# Patient Record
Sex: Male | Born: 1960 | Race: Black or African American | Hispanic: No | Marital: Married | Smoking: Never smoker
Health system: Southern US, Community
[De-identification: ages and names within clinical notes are randomized; demographics above are authoritative.]

## PROBLEM LIST (undated history)

## (undated) DIAGNOSIS — I10 Essential (primary) hypertension: Secondary | ICD-10-CM

## (undated) DIAGNOSIS — E119 Type 2 diabetes mellitus without complications: Secondary | ICD-10-CM

## (undated) HISTORY — PX: COLONOSCOPY: SHX174

---

## 2013-08-08 ENCOUNTER — Emergency Department (HOSPITAL_COMMUNITY)
Admission: EM | Admit: 2013-08-08 | Discharge: 2013-08-08 | Disposition: A | Discharge status: Home / Self Care | Payer: Self-pay | Attending: Emergency Medicine | Admitting: Emergency Medicine

## 2013-08-08 ENCOUNTER — Encounter (HOSPITAL_COMMUNITY): Payer: Self-pay | Admitting: Neurology

## 2013-08-08 ENCOUNTER — Emergency Department (HOSPITAL_COMMUNITY): Payer: Self-pay

## 2013-08-08 DIAGNOSIS — I10 Essential (primary) hypertension: Secondary | ICD-10-CM | POA: Insufficient documentation

## 2013-08-08 DIAGNOSIS — E119 Type 2 diabetes mellitus without complications: Secondary | ICD-10-CM | POA: Insufficient documentation

## 2013-08-08 DIAGNOSIS — M25512 Pain in left shoulder: Secondary | ICD-10-CM

## 2013-08-08 DIAGNOSIS — G8921 Chronic pain due to trauma: Secondary | ICD-10-CM | POA: Insufficient documentation

## 2013-08-08 HISTORY — DX: Type 2 diabetes mellitus without complications: E11.9

## 2013-08-08 HISTORY — DX: Essential (primary) hypertension: I10

## 2013-08-08 MED ORDER — HYDROCODONE-ACETAMINOPHEN 5-325 MG PO TABS: 1.0000 | ORAL_TABLET | ORAL | Status: DC | PRN

## 2013-08-08 NOTE — ED Notes (Signed)
Pt reporting about month ago hurt [place] left shoulder [place]. Was given rx for pain but pain has been worse over past few days. Pt is a x 4. Pt injured shoulder in [place].
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
## 2013-08-12 NOTE — ED Provider Notes (Signed)
I saw and evaluated the patient, reviewed the resident's note and I agree with the findings and plan. Acute on chronic shoulder pain. Previous x-ray reported negative but one today also did not show a fracture. Will foloow with ortho  Juliet Rude. Rubin Payor, MD 08/12/13 1450

## 2014-04-19 IMAGING — CR DG SHOULDER 2+V*L*
5 series · 5 of 5 positions shown · non-contrast
Comparison: 09/18/2009

CLINICAL DATA: Left shoulder pain following injury

EXAM:
LEFT SHOULDER - 2+ VIEW

[w shoulder ap internal left]
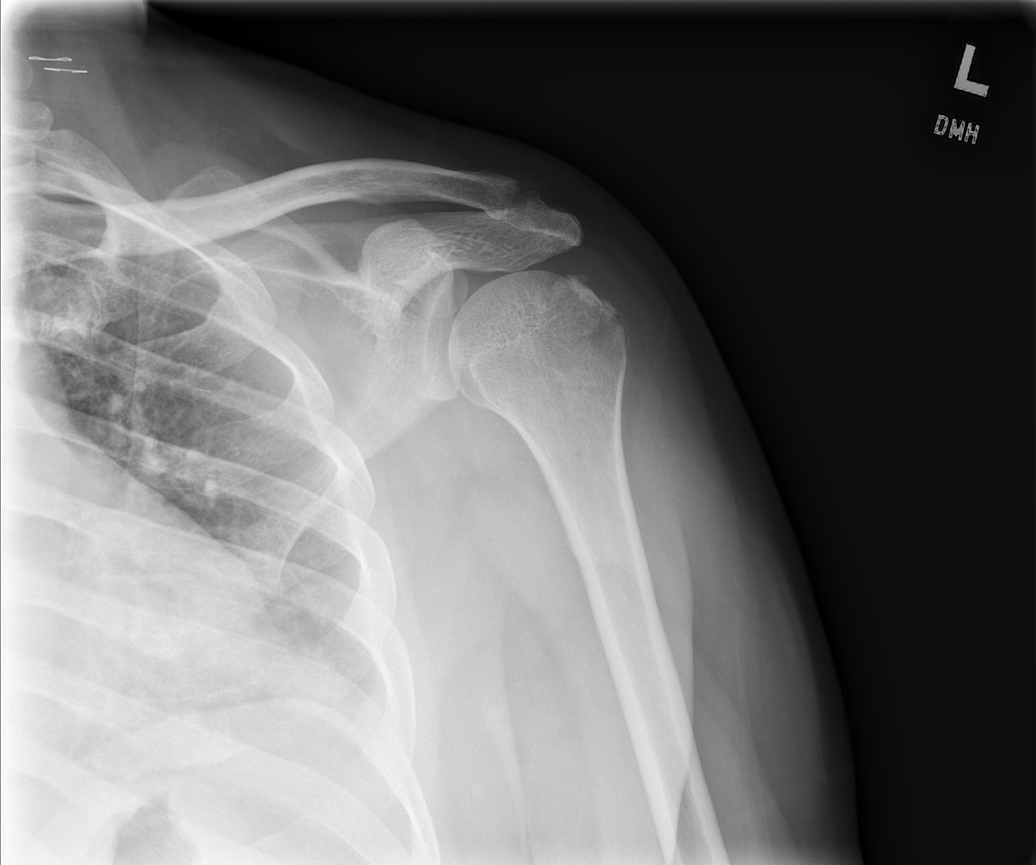

[w shoulder ap external left]
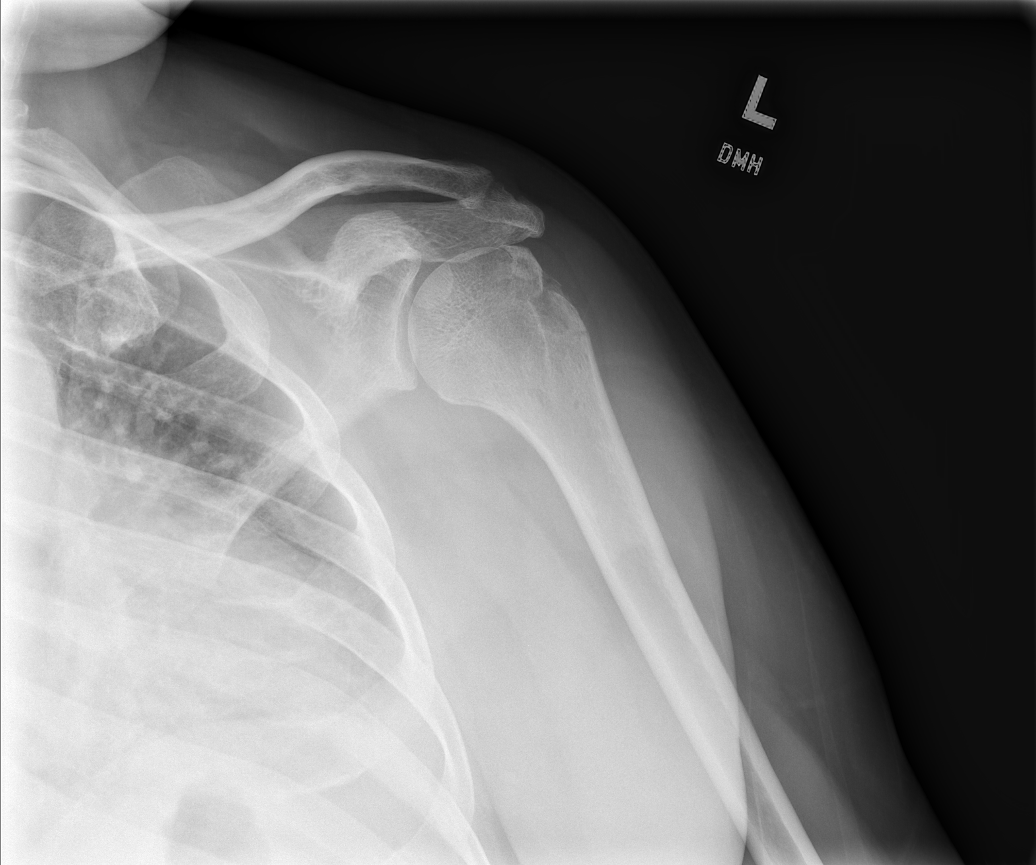

[w shoulder y view left]
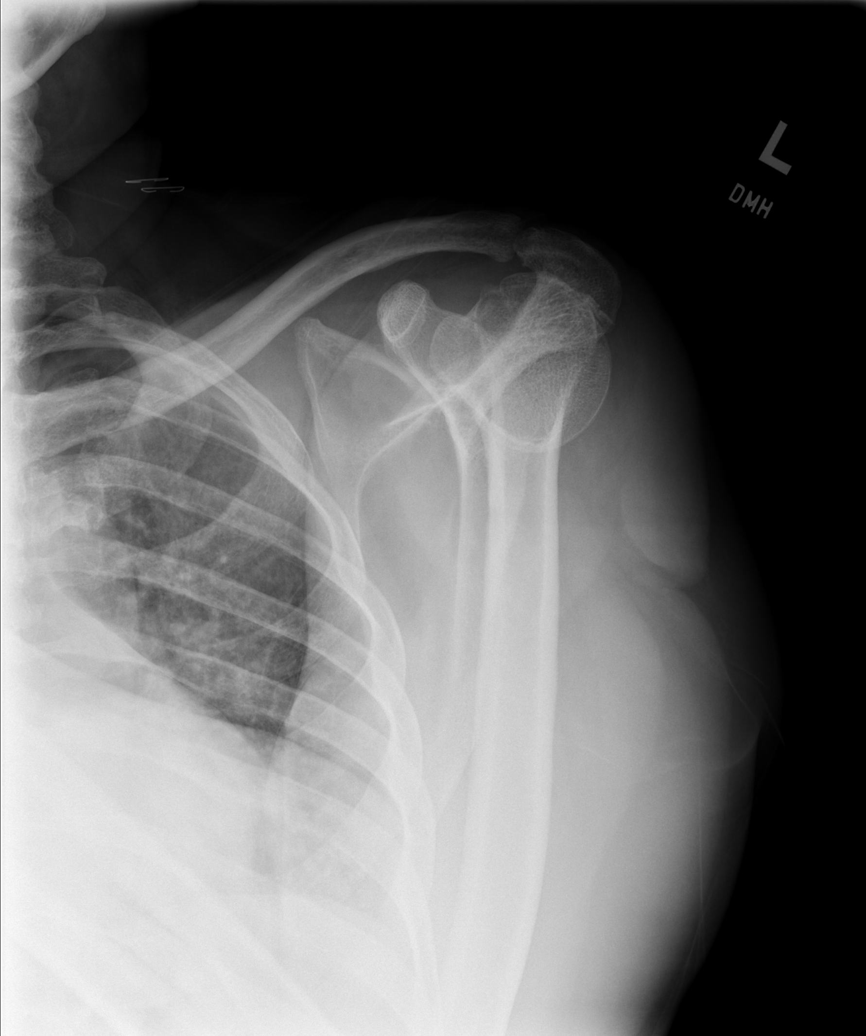

[x shoulder axillary left (1 of 2)]
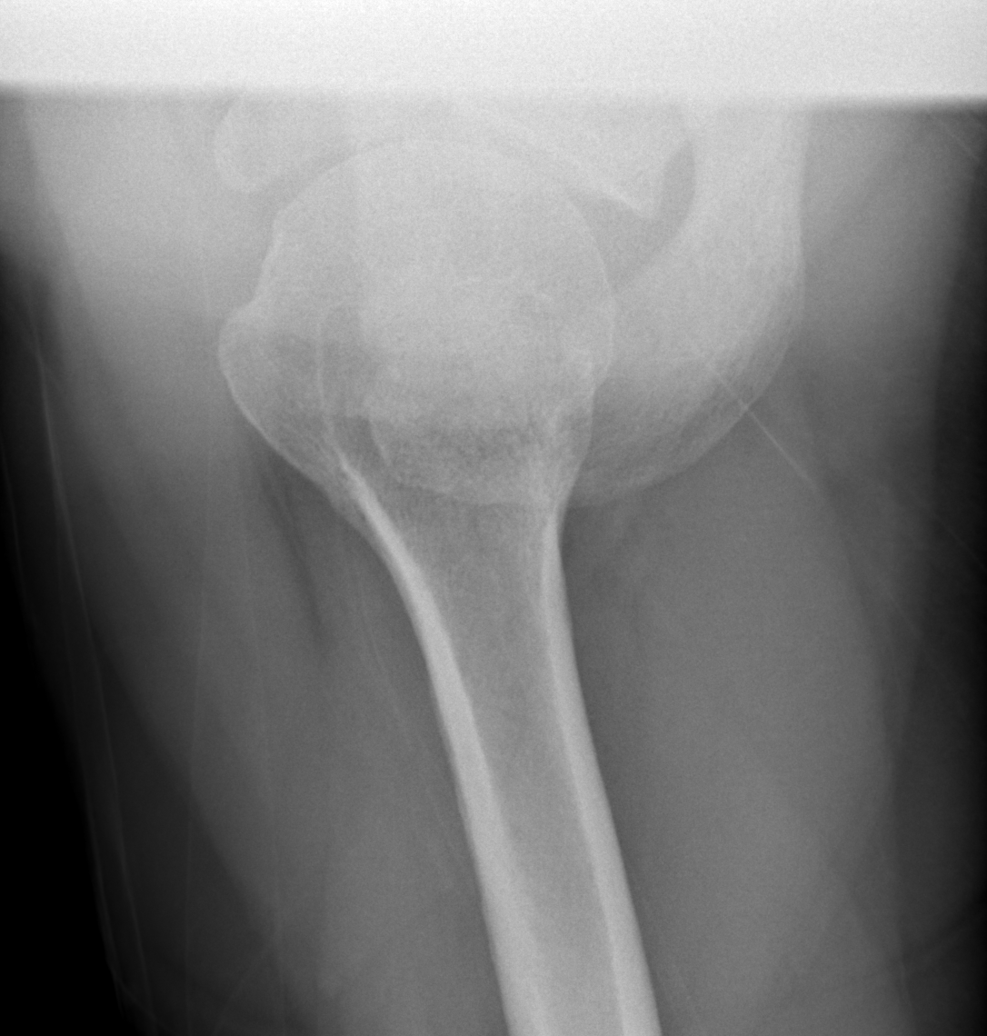

[x shoulder axillary left (2 of 2)]
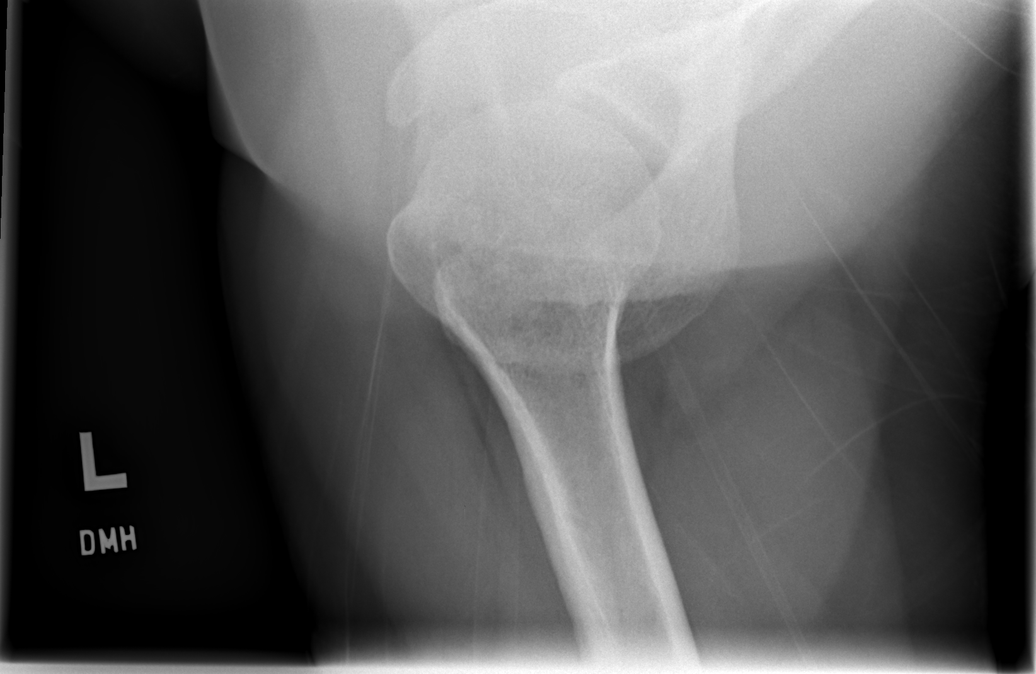

[5 of 5 positions shown; findings below may reference images not displayed]

FINDINGS: There is irregularity of the proximal humerus involving
predominantly the greater tuberosity. There is lucency and some
impaction identified which may be related to chronic dislocations.
Multiple small bony fragments are identified. The majority of these
are well corticated and likely related to prior injury. Additionally
some calcific tendonitis may be present. No other fracture is seen.
IMPRESSION: Changes in the proximal humerus as described above. The majority of
these appear chronic in nature although an acute on chronic
component cannot be totally excluded. These changes are new from a
previous exam dated 09/18/2009. Cross-sectional imaging may be
helpful for further evaluation.

## 2014-07-23 ENCOUNTER — Ambulatory Visit: Payer: Self-pay | Admitting: Internal Medicine

## 2014-08-04 ENCOUNTER — Encounter: Payer: Self-pay | Admitting: Internal Medicine

## 2014-08-04 ENCOUNTER — Ambulatory Visit: Payer: No Typology Code available for payment source | Attending: Internal Medicine | Admitting: Internal Medicine

## 2014-08-04 VITALS — BP 136/87 | HR 76 | Temp 98.0°F | Resp 16 | Wt 239.6 lb

## 2014-08-04 DIAGNOSIS — E119 Type 2 diabetes mellitus without complications: Secondary | ICD-10-CM | POA: Insufficient documentation

## 2014-08-04 DIAGNOSIS — Z139 Encounter for screening, unspecified: Secondary | ICD-10-CM

## 2014-08-04 DIAGNOSIS — I10 Essential (primary) hypertension: Secondary | ICD-10-CM | POA: Insufficient documentation

## 2014-08-04 DIAGNOSIS — E089 Diabetes mellitus due to underlying condition without complications: Secondary | ICD-10-CM

## 2014-08-04 DIAGNOSIS — Z23 Encounter for immunization: Secondary | ICD-10-CM | POA: Insufficient documentation

## 2014-08-04 DIAGNOSIS — E139 Other specified diabetes mellitus without complications: Secondary | ICD-10-CM

## 2014-08-04 LAB — POCT GLYCOSYLATED HEMOGLOBIN (HGB A1C): Hemoglobin A1C: 7.5

## 2014-08-04 LAB — GLUCOSE, POCT (MANUAL RESULT ENTRY): POC Glucose: 264 mg/dl — AB (ref 70–99)

## 2014-08-04 MED ORDER — FREESTYLE SYSTEM KIT: 1.0000 | PACK | Status: AC | PRN

## 2014-08-04 MED ORDER — ATENOLOL 50 MG PO TABS: 50.0000 mg | ORAL_TABLET | Freq: Every day | ORAL | Status: AC

## 2014-08-04 MED ORDER — HYDROCHLOROTHIAZIDE 25 MG PO TABS: 25.0000 mg | ORAL_TABLET | Freq: Every day | ORAL | Status: AC

## 2014-08-04 MED ORDER — SITAGLIPTIN PHOS-METFORMIN HCL 50-1000 MG PO TABS: 1.0000 | ORAL_TABLET | Freq: Two times a day (BID) | ORAL | Status: DC

## 2014-08-04 NOTE — Progress Notes (Signed)
Patient here to establish care Has history of DM and HTN 

## 2014-08-04 NOTE — Progress Notes (Signed)
Patient Demographics  Miguel Valdez, is a 53 y.o. male  XMI:[id number]  MGN:[id number]  DOB - Jan 10, 1961  CC:  Chief Complaint  Patient presents with  . Establish Care       HPI: Miguel Valdez is a 53 y.o. male here today to establish medical care. He has History of hypertension, diabetes, patient is taking atenolol, hydrochlorothiazide and Janumet, as per patient since he did not had insurance he has been taking Janumet one time only, today his hemoglobin A1c is 7.5%, patient denies any hypoglycemic symptoms denies any chest and shortness of breath, patient denies smoking cigarettes. As per patient 2 years ago he had a colonoscopy done, patient will bring the reports on the next visit. Patient has No headache, No chest pain, No abdominal pain - No Nausea, No new weakness tingling or numbness, No Cough - SOB.  No Known Allergies Past Medical History  Diagnosis Date  . Hypertension   . Diabetes mellitus without complication    Current Outpatient Prescriptions on File Prior to Visit  Medication Sig Dispense Refill  . HYDROcodone-acetaminophen (NORCO/VICODIN) 5-325 MG per tablet Take 1 tablet by mouth every 4 (four) hours as needed for pain.  20 tablet  0   No current facility-administered medications on file prior to visit.   Family History  Problem Relation Age of Onset  . Hypertension Mother   . Diabetes Mother   . Cancer Maternal Grandmother    History   Social History  . Marital Status: Married    Spouse Name: N/A    Number of Children: N/A  . Years of Education: N/A   Occupational History  . Not on file.   Social History Main Topics  . Smoking status: Never Smoker   . Smokeless tobacco: Not on file  . Alcohol Use: No  . Drug Use: No  . Sexual Activity: Not on file   Other Topics Concern  . Not on file   Social History Narrative  . No narrative on file    Review of Systems: Constitutional: Negative for fever, chills, diaphoresis, activity change, appetite  change and fatigue. HENT: Negative for ear pain, nosebleeds, congestion, facial swelling, rhinorrhea, neck pain, neck stiffness and ear discharge.  Eyes: Negative for pain, discharge, redness, itching and visual disturbance. Respiratory: Negative for cough, choking, chest tightness, shortness of breath, wheezing and stridor.  Cardiovascular: Negative for chest pain, palpitations and leg swelling. Gastrointestinal: Negative for abdominal distention. Genitourinary: Negative for dysuria, urgency, frequency, hematuria, flank pain, decreased urine volume, difficulty urinating and dyspareunia.  Musculoskeletal: Negative for back pain, joint swelling, arthralgia and gait problem. Neurological: Negative for dizziness, tremors, seizures, syncope, facial asymmetry, speech difficulty, weakness, light-headedness, numbness and headaches.  Hematological: Negative for adenopathy. Does not bruise/bleed easily. Psychiatric/Behavioral: Negative for hallucinations, behavioral problems, confusion, dysphoric mood, decreased concentration and agitation.    Objective:   Filed Vitals:   08/04/14 1407  BP: 136/87  Pulse: 76  Temp: 98 F (36.7 C)  Resp: 16    Physical Exam: Constitutional: Patient appears well-developed and well-nourished. No distress. HENT: Normocephalic, atraumatic, External right and left ear normal. Oropharynx is clear and moist.  Eyes: Conjunctivae and EOM are normal. PERRLA, no scleral icterus. Neck: Normal ROM. Neck supple. No JVD. No tracheal deviation. No thyromegaly. CVS: RRR, S1/S2 +, no murmurs, no gallops, no carotid bruit.  Pulmonary: Effort and breath sounds normal, no stridor, rhonchi, wheezes, rales.  Abdominal: Soft. BS +, no distension, tenderness, rebound or guarding.  Musculoskeletal:  Normal range of motion. No edema and no tenderness.  Neuro: Alert. Normal reflexes, muscle tone coordination. No cranial nerve deficit. Skin: Skin is warm and dry. No rash noted. Not  diaphoretic. No erythema. No pallor. Psychiatric: Normal mood and affect. Behavior, judgment, thought content normal.  No results found for this basename: WBC, HGB, HCT, MCV, PLT   No results found for this basename: CREATININE, BUN, NA, K, CL, CO2    Lab Results  Component Value Date   HGBA1C 7.5% 08/04/2014   Lipid Panel  No results found for this basename: chol, trig, hdl, cholhdl, vldl, ldlcalc       Assessment and plan:   1. Diabetes mellitus due to underlying condition without complications Results for orders placed in visit on 08/04/14  GLUCOSE, POCT (MANUAL RESULT ENTRY)      Result Value Ref Range   POC Glucose 264 (*) 70 - 99 mg/dl  POCT GLYCOSYLATED HEMOGLOBIN (HGB A1C)      Result Value Ref Range   Hemoglobin A1C 7.5%     Patient is advised for diabetes meal planning, he is given prescription for Janumet advise patient to take 2 times a day, will repeat his A1c in 3 months.  - COMPLETE METABOLIC PANEL WITH GFR; Future - Ambulatory referral to Ophthalmology - sitaGLIPtin-metformin (JANUMET) 50-1000 MG per tablet; Take 1 tablet by mouth 2 (two) times daily with a meal.  Dispense: 60 tablet; Refill: 3 - glucose monitoring kit (FREESTYLE) monitoring kit; 1 each by Does not apply route as needed for other. Dispense any model that is covered- dispense testing supplies for Q AC/ HS accuchecks- 1 month supply with one refil.  Dispense: 1 each; Refill: 1  2. Essential hypertension, benign  blood pressure is borderline elevated, advise patient for DASH diet, continue with Current meds - hydrochlorothiazide (HYDRODIURIL) 25 MG tablet; Take 1 tablet (25 mg total) by mouth daily.  Dispense: 30 tablet; Refill: 3 - atenolol (TENORMIN) 50 MG tablet; Take 1 tablet (50 mg total) by mouth daily.  Dispense: 30 tablet; Refill: 3  3. Screening  ordered fasting blood work.   - CBC with Differential; Future - Lipid panel; Future - TSH; Future - Vit D  25 hydroxy (rtn osteoporosis  monitoring); Future      Health Maintenance -Colonoscopy: uptodate patient will bring the reports     -Influenza shot today   Return in about 3 months (around 11/03/2014) for diabetes, hypertension.   Lorayne Marek, MD

## 2014-08-04 NOTE — Patient Instructions (Addendum)
Diabetes Mellitus and Food It is important for you to manage your blood sugar (glucose) level. Your blood glucose level can be greatly affected by what you eat. Eating healthier foods in the appropriate amounts throughout the day at about the same time each day will help you control your blood glucose level. It can also help slow or prevent worsening of your diabetes mellitus. Healthy eating may even help you improve the level of your blood pressure and reach or maintain a healthy weight.  HOW CAN FOOD AFFECT ME? Carbohydrates Carbohydrates affect your blood glucose level more than any other type of food. Your dietitian will help you determine how many carbohydrates to eat at each meal and teach you how to count carbohydrates. Counting carbohydrates is important to keep your blood glucose at a healthy level, especially if you are using insulin or taking certain medicines for diabetes mellitus. Alcohol Alcohol can cause sudden decreases in blood glucose (hypoglycemia), especially if you use insulin or take certain medicines for diabetes mellitus. Hypoglycemia can be a life-threatening condition. Symptoms of hypoglycemia (sleepiness, dizziness, and disorientation) are similar to symptoms of having too much alcohol.  If your health care provider has given you approval to drink alcohol, do so in moderation and use the following guidelines:  Women should not have more than one drink per day, and men should not have more than two drinks per day. One drink is equal to:  12 oz of beer.  5 oz of wine.  1 oz of hard liquor.  Do not drink on an empty stomach.  Keep yourself hydrated. Have water, diet soda, or unsweetened iced tea.  Regular soda, juice, and other mixers might contain a lot of carbohydrates and should be counted. WHAT FOODS ARE NOT RECOMMENDED? As you make food choices, it is important to remember that all foods are not the same. Some foods have fewer nutrients per serving than other  foods, even though they might have the same number of calories or carbohydrates. It is difficult to get your body what it needs when you eat foods with fewer nutrients. Examples of foods that you should avoid that are high in calories and carbohydrates but low in nutrients include:  Trans fats (most processed foods list trans fats on the Nutrition Facts label).  Regular soda.  Juice.  Candy.  Sweets, such as cake, pie, doughnuts, and cookies.  Fried foods. WHAT FOODS CAN I EAT? Have nutrient-rich foods, which will nourish your body and keep you healthy. The food you should eat also will depend on several factors, including:  The calories you need.  The medicines you take.  Your weight.  Your blood glucose level.  Your blood pressure level.  Your cholesterol level. You also should eat a variety of foods, including:  Protein, such as meat, poultry, fish, tofu, nuts, and seeds (lean animal proteins are best).  Fruits.  Vegetables.  Dairy products, such as milk, cheese, and yogurt (low fat is best).  Breads, grains, pasta, cereal, rice, and beans.  Fats such as olive oil, trans fat-free margarine, canola oil, avocado, and olives. DOES EVERYONE WITH DIABETES MELLITUS HAVE THE SAME MEAL PLAN? Because every person with diabetes mellitus is different, there is not one meal plan that works for everyone. It is very important that you meet with a dietitian who will help you create a meal plan that is just right for you. Document Released: 07/21/2005 Document Revised: 10/29/2013 Document Reviewed: 09/20/2013 ExitCare Patient Information 2015 [employer]. This   information is not intended to replace advice given to you by your health care provider. Make sure you discuss any questions you have with your health care provider. DASH Eating Plan DASH stands for "Dietary Approaches to Stop Hypertension." The DASH eating plan is a healthy eating plan that has been shown to reduce high  blood pressure (hypertension). Additional health benefits may include reducing the risk of type 2 diabetes mellitus, heart disease, and stroke. The DASH eating plan may also help with weight loss. WHAT DO I NEED TO KNOW ABOUT THE DASH EATING PLAN? For the DASH eating plan, you will follow these general guidelines:  Choose foods with a percent daily value for sodium of less than 5% (as listed on the food label).  Use salt-free seasonings or herbs instead of table salt or sea salt.  Check with your health care provider or pharmacist before using salt substitutes.  Eat lower-sodium products, often labeled as "lower sodium" or "no salt added."  Eat fresh foods.  Eat more vegetables, fruits, and low-fat dairy products.  Choose whole grains. Look for the word "whole" as the first word in the ingredient list.  Choose fish and skinless chicken or turkey more often than red meat. Limit fish, poultry, and meat to 6 oz (170 g) each day.  Limit sweets, desserts, sugars, and sugary drinks.  Choose heart-healthy fats.  Limit cheese to 1 oz (28 g) per day.  Eat more home-cooked food and less restaurant, buffet, and fast food.  Limit fried foods.  Cook foods using methods other than frying.  Limit canned vegetables. If you do use them, rinse them well to decrease the sodium.  When eating at a restaurant, ask that your food be prepared with less salt, or no salt if possible. WHAT FOODS CAN I EAT? Seek help from a dietitian for individual calorie needs. Grains Whole grain or whole wheat bread. Brown rice. Whole grain or whole wheat pasta. Quinoa, bulgur, and whole grain cereals. Low-sodium cereals. Corn or whole wheat flour tortillas. Whole grain cornbread. Whole grain crackers. Low-sodium crackers. Vegetables Fresh or frozen vegetables (raw, steamed, roasted, or grilled). Low-sodium or reduced-sodium tomato and vegetable juices. Low-sodium or reduced-sodium tomato sauce and paste. Low-sodium  or reduced-sodium canned vegetables.  Fruits All fresh, canned (in natural juice), or frozen fruits. Meat and Other Protein Products Ground beef (85% or leaner), grass-fed beef, or beef trimmed of fat. Skinless chicken or turkey. Ground chicken or turkey. Pork trimmed of fat. All fish and seafood. Eggs. Dried beans, peas, or lentils. Unsalted nuts and seeds. Unsalted canned beans. Dairy Low-fat dairy products, such as skim or 1% milk, 2% or reduced-fat cheeses, low-fat ricotta or cottage cheese, or plain low-fat yogurt. Low-sodium or reduced-sodium cheeses. Fats and Oils Tub margarines without trans fats. Light or reduced-fat mayonnaise and salad dressings (reduced sodium). Avocado. Safflower, olive, or canola oils. Natural peanut or almond butter. Other Unsalted popcorn and pretzels. The items listed above may not be a complete list of recommended foods or beverages. Contact your dietitian for more options. WHAT FOODS ARE NOT RECOMMENDED? Grains White bread. White pasta. White rice. Refined cornbread. Bagels and croissants. Crackers that contain trans fat. Vegetables Creamed or fried vegetables. Vegetables in a cheese sauce. Regular canned vegetables. Regular canned tomato sauce and paste. Regular tomato and vegetable juices. Fruits Dried fruits. Canned fruit in light or heavy syrup. Fruit juice. Meat and Other Protein Products Fatty cuts of meat. Ribs, chicken wings, bacon, sausage, bologna, salami, chitterlings, fatback, hot   dogs, bratwurst, and packaged luncheon meats. Salted nuts and seeds. Canned beans with salt. Dairy Whole or 2% milk, cream, half-and-half, and cream cheese. Whole-fat or sweetened yogurt. Full-fat cheeses or blue cheese. Nondairy creamers and whipped toppings. Processed cheese, cheese spreads, or cheese curds. Condiments Onion and garlic salt, seasoned salt, table salt, and sea salt. Canned and packaged gravies. Worcestershire sauce. Tartar sauce. Barbecue sauce.  Teriyaki sauce. Soy sauce, including reduced sodium. Steak sauce. Fish sauce. Oyster sauce. Cocktail sauce. Horseradish. Ketchup and mustard. Meat flavorings and tenderizers. Bouillon cubes. Hot sauce. Tabasco sauce. Marinades. Taco seasonings. Relishes. Fats and Oils Butter, stick margarine, lard, shortening, ghee, and bacon fat. Coconut, palm kernel, or palm oils. Regular salad dressings. Other Pickles and olives. Salted popcorn and pretzels. The items listed above may not be a complete list of foods and beverages to avoid. Contact your dietitian for more information. WHERE CAN I FIND MORE INFORMATION? National Heart, [employer], and [employer] Institute: www.nhlbi.nih.gov/health/health-topics/topics/dash/ Document Released: 10/13/2011 Document Revised: 03/10/2014 Document Reviewed: 08/28/2013 ExitCare Patient Information 2015 [employer]. This information is not intended to replace advice given to you by your health care provider. Make sure you discuss any questions you have with your health care provider.  

## 2014-08-07 ENCOUNTER — Ambulatory Visit: Payer: No Typology Code available for payment source | Attending: Internal Medicine

## 2014-08-11 ENCOUNTER — Ambulatory Visit: Payer: No Typology Code available for payment source | Attending: Internal Medicine

## 2014-08-11 DIAGNOSIS — E089 Diabetes mellitus due to underlying condition without complications: Secondary | ICD-10-CM

## 2014-08-11 DIAGNOSIS — Z139 Encounter for screening, unspecified: Secondary | ICD-10-CM

## 2014-08-11 LAB — CBC WITH DIFFERENTIAL/PLATELET
BASOS PCT: 1 % (ref 0–1)
Basophils Absolute: 0.1 10*3/uL (ref 0.0–0.1)
Eosinophils Absolute: 0.3 10*3/uL (ref 0.0–0.7)
Eosinophils Relative: 5 % (ref 0–5)
HCT: 40.9 % (ref 39.0–52.0)
Hemoglobin: 13.8 g/dL (ref 13.0–17.0)
LYMPHS PCT: 31 % (ref 12–46)
Lymphs Abs: 2.1 10*3/uL (ref 0.7–4.0)
MCH: 26.1 pg (ref 26.0–34.0)
MCHC: 33.7 g/dL (ref 30.0–36.0)
MCV: 77.3 fL — ABNORMAL LOW (ref 78.0–100.0)
MONO ABS: 0.5 10*3/uL (ref 0.1–1.0)
Monocytes Relative: 7 % (ref 3–12)
NEUTROS PCT: 56 % (ref 43–77)
Neutro Abs: 3.9 10*3/uL (ref 1.7–7.7)
Platelets: 291 10*3/uL (ref 150–400)
RBC: 5.29 MIL/uL (ref 4.22–5.81)
RDW: 14.5 % (ref 11.5–15.5)
WBC: 6.9 10*3/uL (ref 4.0–10.5)

## 2014-08-11 LAB — COMPLETE METABOLIC PANEL WITH GFR
ALBUMIN: 3.6 g/dL (ref 3.5–5.2)
ALT: 20 U/L (ref 0–53)
AST: 16 U/L (ref 0–37)
Alkaline Phosphatase: 82 U/L (ref 39–117)
BUN: 17 mg/dL (ref 6–23)
CALCIUM: 9.4 mg/dL (ref 8.4–10.5)
CO2: 28 mEq/L (ref 19–32)
Chloride: 99 mEq/L (ref 96–112)
Creat: 0.95 mg/dL (ref 0.50–1.35)
Glucose, Bld: 149 mg/dL — ABNORMAL HIGH (ref 70–99)
POTASSIUM: 4.6 meq/L (ref 3.5–5.3)
Sodium: 139 mEq/L (ref 135–145)
Total Bilirubin: 0.5 mg/dL (ref 0.2–1.2)
Total Protein: 8 g/dL (ref 6.0–8.3)

## 2014-08-11 LAB — LIPID PANEL
CHOL/HDL RATIO: 4 ratio
Cholesterol: 158 mg/dL (ref 0–200)
HDL: 40 mg/dL (ref >39)
LDL CALC: 87 mg/dL (ref 0–99)
TRIGLYCERIDES: 153 mg/dL — AB (ref <150)
VLDL: 31 mg/dL (ref 0–40)

## 2014-08-11 LAB — TSH: TSH: 1.441 u[IU]/mL (ref 0.350–4.500)

## 2014-08-12 ENCOUNTER — Other Ambulatory Visit: Payer: Self-pay

## 2014-08-12 DIAGNOSIS — E089 Diabetes mellitus due to underlying condition without complications: Secondary | ICD-10-CM

## 2014-08-12 LAB — VITAMIN D 25 HYDROXY (VIT D DEFICIENCY, FRACTURES): Vit D, 25-Hydroxy: 35 ng/mL (ref 30–89)

## 2014-08-12 MED ORDER — SITAGLIPTIN PHOS-METFORMIN HCL 50-1000 MG PO TABS: 1.0000 | ORAL_TABLET | Freq: Two times a day (BID) | ORAL | Status: AC

## 2014-08-13 ENCOUNTER — Telehealth: Payer: Self-pay | Admitting: *Deleted

## 2014-08-13 NOTE — Telephone Encounter (Signed)
Message copied by Raynelle CharyWINFREE, Naveh Rickles R on Wed Aug 13, 2014 10:11 AM ------      Message from: Doris CheadleADVANI, DEEPAK      Created: Tue Aug 12, 2014  5:01 PM       Call and let the patient know that his blood work is otherwise normal except for elevated glucose level, advise patient for low carbohydrate diet and compliance with  taking diabetes medication. ------

## 2014-08-13 NOTE — Telephone Encounter (Signed)
Pt is aware of his lab results. 

## 2020-02-13 ENCOUNTER — Encounter (HOSPITAL_COMMUNITY): Payer: Self-pay

## 2020-02-13 ENCOUNTER — Emergency Department (HOSPITAL_COMMUNITY): Payer: PRIVATE HEALTH INSURANCE

## 2020-02-13 ENCOUNTER — Other Ambulatory Visit: Payer: Self-pay

## 2020-02-13 ENCOUNTER — Inpatient Hospital Stay (HOSPITAL_COMMUNITY)
Admission: EM | Admit: 2020-02-13 | Discharge: 2020-02-18 | DRG: 177 | Disposition: A | Discharge status: Home / Self Care | Payer: PRIVATE HEALTH INSURANCE | Attending: Internal Medicine | Admitting: Internal Medicine

## 2020-02-13 DIAGNOSIS — R0602 Shortness of breath: Secondary | ICD-10-CM | POA: Diagnosis not present

## 2020-02-13 DIAGNOSIS — E860 Dehydration: Secondary | ICD-10-CM | POA: Diagnosis present

## 2020-02-13 DIAGNOSIS — J9601 Acute respiratory failure with hypoxia: Secondary | ICD-10-CM | POA: Diagnosis present

## 2020-02-13 DIAGNOSIS — Z809 Family history of malignant neoplasm, unspecified: Secondary | ICD-10-CM

## 2020-02-13 DIAGNOSIS — I10 Essential (primary) hypertension: Secondary | ICD-10-CM | POA: Diagnosis present

## 2020-02-13 DIAGNOSIS — Z833 Family history of diabetes mellitus: Secondary | ICD-10-CM

## 2020-02-13 DIAGNOSIS — Z7982 Long term (current) use of aspirin: Secondary | ICD-10-CM

## 2020-02-13 DIAGNOSIS — U071 COVID-19: Principal | ICD-10-CM | POA: Diagnosis present

## 2020-02-13 DIAGNOSIS — R0902 Hypoxemia: Secondary | ICD-10-CM

## 2020-02-13 DIAGNOSIS — J1282 Pneumonia due to coronavirus disease 2019: Secondary | ICD-10-CM | POA: Diagnosis present

## 2020-02-13 DIAGNOSIS — Z7984 Long term (current) use of oral hypoglycemic drugs: Secondary | ICD-10-CM

## 2020-02-13 DIAGNOSIS — E871 Hypo-osmolality and hyponatremia: Secondary | ICD-10-CM | POA: Diagnosis present

## 2020-02-13 DIAGNOSIS — E089 Diabetes mellitus due to underlying condition without complications: Secondary | ICD-10-CM | POA: Diagnosis present

## 2020-02-13 DIAGNOSIS — E119 Type 2 diabetes mellitus without complications: Secondary | ICD-10-CM | POA: Diagnosis present

## 2020-02-13 DIAGNOSIS — Z8249 Family history of ischemic heart disease and other diseases of the circulatory system: Secondary | ICD-10-CM

## 2020-02-13 LAB — BASIC METABOLIC PANEL
Anion gap: 12 (ref 5–15)
BUN: 8 mg/dL (ref 6–20)
CO2: 23 mmol/L (ref 22–32)
Calcium: 8 mg/dL — ABNORMAL LOW (ref 8.9–10.3)
Chloride: 92 mmol/L — ABNORMAL LOW (ref 98–111)
Creatinine, Ser: 1.11 mg/dL (ref 0.61–1.24)
GFR calc Af Amer: >60 mL/min (ref >60)
GFR calc non Af Amer: >60 mL/min (ref >60)
Glucose, Bld: 280 mg/dL — ABNORMAL HIGH (ref 70–99)
Potassium: 4.1 mmol/L (ref 3.5–5.1)
Sodium: 127 mmol/L — ABNORMAL LOW (ref 135–145)

## 2020-02-13 LAB — CBC
HCT: 45.3 % (ref 39.0–52.0)
Hemoglobin: 14.8 g/dL (ref 13.0–17.0)
MCH: 26.8 pg (ref 26.0–34.0)
MCHC: 32.7 g/dL (ref 30.0–36.0)
MCV: 81.9 fL (ref 80.0–100.0)
Platelets: 177 10*3/uL (ref 150–400)
RBC: 5.53 MIL/uL (ref 4.22–5.81)
RDW: 14.6 % (ref 11.5–15.5)
WBC: 4.5 10*3/uL (ref 4.0–10.5)
nRBC: 0 % (ref 0.0–0.2)

## 2020-02-13 LAB — TROPONIN I (HIGH SENSITIVITY)
Troponin I (High Sensitivity): 6 ng/L (ref <18)
Troponin I (High Sensitivity): 5 ng/L (ref <18)

## 2020-02-13 MED ORDER — SODIUM CHLORIDE 0.9% FLUSH: 3.0000 mL | Freq: Once | INTRAVENOUS | Status: DC

## 2020-02-13 NOTE — ED Triage Notes (Addendum)
Pt arrives to ED w/ c/o SOB secondary to covid. Pt test positive yesterday. Resp e/u

## 2020-02-14 ENCOUNTER — Encounter (HOSPITAL_COMMUNITY): Payer: Self-pay | Admitting: Internal Medicine

## 2020-02-14 DIAGNOSIS — J9600 Acute respiratory failure, unspecified whether with hypoxia or hypercapnia: Secondary | ICD-10-CM

## 2020-02-14 DIAGNOSIS — U071 COVID-19: Secondary | ICD-10-CM | POA: Diagnosis present

## 2020-02-14 DIAGNOSIS — Z833 Family history of diabetes mellitus: Secondary | ICD-10-CM | POA: Diagnosis not present

## 2020-02-14 DIAGNOSIS — Z809 Family history of malignant neoplasm, unspecified: Secondary | ICD-10-CM | POA: Diagnosis not present

## 2020-02-14 DIAGNOSIS — E871 Hypo-osmolality and hyponatremia: Secondary | ICD-10-CM | POA: Diagnosis present

## 2020-02-14 DIAGNOSIS — Z7982 Long term (current) use of aspirin: Secondary | ICD-10-CM | POA: Diagnosis not present

## 2020-02-14 DIAGNOSIS — E119 Type 2 diabetes mellitus without complications: Secondary | ICD-10-CM | POA: Diagnosis present

## 2020-02-14 DIAGNOSIS — E860 Dehydration: Secondary | ICD-10-CM | POA: Diagnosis present

## 2020-02-14 DIAGNOSIS — E089 Diabetes mellitus due to underlying condition without complications: Secondary | ICD-10-CM

## 2020-02-14 DIAGNOSIS — J1282 Pneumonia due to coronavirus disease 2019: Secondary | ICD-10-CM | POA: Diagnosis present

## 2020-02-14 DIAGNOSIS — J9601 Acute respiratory failure with hypoxia: Secondary | ICD-10-CM | POA: Diagnosis present

## 2020-02-14 DIAGNOSIS — I10 Essential (primary) hypertension: Secondary | ICD-10-CM | POA: Diagnosis present

## 2020-02-14 DIAGNOSIS — R0602 Shortness of breath: Secondary | ICD-10-CM | POA: Diagnosis present

## 2020-02-14 DIAGNOSIS — Z8249 Family history of ischemic heart disease and other diseases of the circulatory system: Secondary | ICD-10-CM | POA: Diagnosis not present

## 2020-02-14 DIAGNOSIS — Z7984 Long term (current) use of oral hypoglycemic drugs: Secondary | ICD-10-CM | POA: Diagnosis not present

## 2020-02-14 LAB — CBC WITH DIFFERENTIAL/PLATELET
Abs Immature Granulocytes: 0.03 10*3/uL (ref 0.00–0.07)
Basophils Absolute: 0 10*3/uL (ref 0.0–0.1)
Basophils Relative: 0 %
Eosinophils Absolute: 0 10*3/uL (ref 0.0–0.5)
Eosinophils Relative: 0 %
HCT: 45.4 % (ref 39.0–52.0)
Hemoglobin: 14.8 g/dL (ref 13.0–17.0)
Immature Granulocytes: 0 %
Lymphocytes Relative: 7 %
Lymphs Abs: 0.5 10*3/uL — ABNORMAL LOW (ref 0.7–4.0)
MCH: 26.6 pg (ref 26.0–34.0)
MCHC: 32.6 g/dL (ref 30.0–36.0)
MCV: 81.5 fL (ref 80.0–100.0)
Monocytes Absolute: 0.2 10*3/uL (ref 0.1–1.0)
Monocytes Relative: 3 %
Neutro Abs: 6.3 10*3/uL (ref 1.7–7.7)
Neutrophils Relative %: 90 %
Platelets: 160 10*3/uL (ref 150–400)
RBC: 5.57 MIL/uL (ref 4.22–5.81)
RDW: 14.7 % (ref 11.5–15.5)
WBC: 7 10*3/uL (ref 4.0–10.5)
nRBC: 0 % (ref 0.0–0.2)

## 2020-02-14 LAB — PROCALCITONIN: Procalcitonin: 0.11 ng/mL

## 2020-02-14 LAB — GLUCOSE, CAPILLARY
Glucose-Capillary: 384 mg/dL — ABNORMAL HIGH (ref 70–99)
Glucose-Capillary: 382 mg/dL — ABNORMAL HIGH (ref 70–99)
Glucose-Capillary: 312 mg/dL — ABNORMAL HIGH (ref 70–99)

## 2020-02-14 LAB — TROPONIN I (HIGH SENSITIVITY)
Troponin I (High Sensitivity): 4 ng/L (ref <18)
Troponin I (High Sensitivity): 5 ng/L (ref <18)

## 2020-02-14 LAB — COMPREHENSIVE METABOLIC PANEL
ALT: 34 U/L (ref 0–44)
AST: 63 U/L — ABNORMAL HIGH (ref 15–41)
Albumin: 2.7 g/dL — ABNORMAL LOW (ref 3.5–5.0)
Alkaline Phosphatase: 75 U/L (ref 38–126)
Anion gap: 15 (ref 5–15)
BUN: 12 mg/dL (ref 6–20)
CO2: 20 mmol/L — ABNORMAL LOW (ref 22–32)
Calcium: 8 mg/dL — ABNORMAL LOW (ref 8.9–10.3)
Chloride: 92 mmol/L — ABNORMAL LOW (ref 98–111)
Creatinine, Ser: 1.06 mg/dL (ref 0.61–1.24)
GFR calc Af Amer: >60 mL/min (ref >60)
GFR calc non Af Amer: >60 mL/min (ref >60)
Glucose, Bld: 320 mg/dL — ABNORMAL HIGH (ref 70–99)
Potassium: 5.1 mmol/L (ref 3.5–5.1)
Sodium: 127 mmol/L — ABNORMAL LOW (ref 135–145)
Total Bilirubin: 1.6 mg/dL — ABNORMAL HIGH (ref 0.3–1.2)
Total Protein: 7.4 g/dL (ref 6.5–8.1)

## 2020-02-14 LAB — FERRITIN: Ferritin: 456 ng/mL — ABNORMAL HIGH (ref 24–336)

## 2020-02-14 LAB — C-REACTIVE PROTEIN: CRP: 15.1 mg/dL — ABNORMAL HIGH (ref <1.0)

## 2020-02-14 LAB — CBG MONITORING, ED: Glucose-Capillary: 310 mg/dL — ABNORMAL HIGH (ref 70–99)

## 2020-02-14 LAB — ABO/RH: ABO/RH(D): AB POS

## 2020-02-14 LAB — OSMOLALITY, URINE: Osmolality, Ur: 494 mOsm/kg (ref 300–900)

## 2020-02-14 LAB — BRAIN NATRIURETIC PEPTIDE: B Natriuretic Peptide: 15.8 pg/mL (ref 0.0–100.0)

## 2020-02-14 LAB — D-DIMER, QUANTITATIVE: D-Dimer, Quant: 1.13 ug/mL-FEU — ABNORMAL HIGH (ref 0.00–0.50)

## 2020-02-14 LAB — SODIUM, URINE, RANDOM: Sodium, Ur: <10 mmol/L

## 2020-02-14 LAB — HIV ANTIBODY (ROUTINE TESTING W REFLEX): HIV Screen 4th Generation wRfx: NONREACTIVE

## 2020-02-14 MED ORDER — SODIUM CHLORIDE 0.9 % IV SOLN: 100.0000 mg | Freq: Every day | INTRAVENOUS | Status: DC

## 2020-02-14 MED ORDER — INSULIN DETEMIR 100 UNIT/ML ~~LOC~~ SOLN
10.0000 [IU] | Freq: Two times a day (BID) | SUBCUTANEOUS | Status: DC
  Administered 2020-02-14 – 2020-02-15 (×2): 10 [IU] via SUBCUTANEOUS
  Filled 2020-02-14 (×5): qty 0.1

## 2020-02-14 MED ORDER — SODIUM CHLORIDE 0.9 % IV SOLN: 200.0000 mg | Freq: Once | INTRAVENOUS | Status: DC

## 2020-02-14 MED ORDER — ASPIRIN EC 81 MG PO TBEC
81.0000 mg | DELAYED_RELEASE_TABLET | Freq: Every day | ORAL | Status: DC
  Administered 2020-02-14 – 2020-02-17 (×4): 81 mg via ORAL
  Filled 2020-02-14 (×4): qty 1

## 2020-02-14 MED ORDER — SODIUM CHLORIDE 0.9 % IV SOLN
100.0000 mg | INTRAVENOUS | Status: AC
  Administered 2020-02-14 (×2): 100 mg via INTRAVENOUS
  Filled 2020-02-14 (×2): qty 20

## 2020-02-14 MED ORDER — DEXAMETHASONE SODIUM PHOSPHATE 10 MG/ML IJ SOLN
10.0000 mg | Freq: Once | INTRAMUSCULAR | Status: AC
  Administered 2020-02-14: 10 mg via INTRAVENOUS
  Filled 2020-02-14: qty 1

## 2020-02-14 MED ORDER — SODIUM CHLORIDE 0.9 % IV SOLN
100.0000 mg | Freq: Every day | INTRAVENOUS | Status: AC
  Administered 2020-02-15 – 2020-02-18 (×4): 100 mg via INTRAVENOUS
  Filled 2020-02-14 (×4): qty 20

## 2020-02-14 MED ORDER — TOCILIZUMAB 400 MG/20ML IV SOLN
800.0000 mg | Freq: Once | INTRAVENOUS | Status: AC
  Administered 2020-02-14: 16:00:00 800 mg via INTRAVENOUS
  Filled 2020-02-14: qty 40

## 2020-02-14 MED ORDER — ONDANSETRON HCL 4 MG/2ML IJ SOLN: 4.0000 mg | Freq: Four times a day (QID) | INTRAMUSCULAR | Status: DC | PRN

## 2020-02-14 MED ORDER — BENZONATATE 100 MG PO CAPS
100.0000 mg | ORAL_CAPSULE | Freq: Three times a day (TID) | ORAL | Status: DC | PRN
  Administered 2020-02-14 – 2020-02-17 (×2): 100 mg via ORAL
  Filled 2020-02-14 (×2): qty 1

## 2020-02-14 MED ORDER — ENOXAPARIN SODIUM 40 MG/0.4ML ~~LOC~~ SOLN
40.0000 mg | Freq: Every day | SUBCUTANEOUS | Status: DC
  Administered 2020-02-14 – 2020-02-15 (×2): 40 mg via SUBCUTANEOUS
  Filled 2020-02-14 (×2): qty 0.4

## 2020-02-14 MED ORDER — ALBUTEROL SULFATE HFA 108 (90 BASE) MCG/ACT IN AERS
4.0000 | INHALATION_SPRAY | Freq: Once | RESPIRATORY_TRACT | Status: AC
  Administered 2020-02-14: 02:00:00 4 via RESPIRATORY_TRACT
  Filled 2020-02-14: qty 6.7

## 2020-02-14 MED ORDER — DEXAMETHASONE SODIUM PHOSPHATE 10 MG/ML IJ SOLN
6.0000 mg | INTRAMUSCULAR | Status: DC
  Administered 2020-02-15 – 2020-02-17 (×3): 6 mg via INTRAVENOUS
  Filled 2020-02-14 (×3): qty 1

## 2020-02-14 MED ORDER — BISOPROLOL FUMARATE 5 MG PO TABS
5.0000 mg | ORAL_TABLET | Freq: Every day | ORAL | Status: DC
  Administered 2020-02-14 – 2020-02-15 (×2): 5 mg via ORAL
  Filled 2020-02-14 (×2): qty 1

## 2020-02-14 MED ORDER — INSULIN ASPART 100 UNIT/ML ~~LOC~~ SOLN
0.0000 [IU] | Freq: Three times a day (TID) | SUBCUTANEOUS | Status: DC
  Administered 2020-02-14 (×2): 9 [IU] via SUBCUTANEOUS
  Administered 2020-02-14: 08:00:00 7 [IU] via SUBCUTANEOUS
  Administered 2020-02-15: 09:00:00 5 [IU] via SUBCUTANEOUS
  Administered 2020-02-15: 9 [IU] via SUBCUTANEOUS

## 2020-02-14 MED ORDER — ONDANSETRON HCL 4 MG PO TABS: 4.0000 mg | ORAL_TABLET | Freq: Four times a day (QID) | ORAL | Status: DC | PRN

## 2020-02-14 NOTE — ED Notes (Signed)
Breakfast Ordered 

## 2020-02-14 NOTE — ED Notes (Signed)
Tele

## 2020-02-14 NOTE — ED Notes (Signed)
Miguel Valdez wife [phone] looking for an update

## 2020-02-14 NOTE — ED Provider Notes (Addendum)
[facility] EMERGENCY DEPARTMENT Provider Note   CSN: [id number] Arrival date & time: 02/13/20  1913     History Chief Complaint  Patient presents with  . Shortness of Breath    Miguel Valdez is a 59 y.o. male.  Patient to ED with shortness of breath that started today. He reports being diagnosed with COVID at [facility] yesterday after developing symptoms of excessive fatigue. Today he started having SOB, worse with walking. No nausea, vomiting or chest pain. No congestion. No loss of taste or smell. History of HTN and DM  The history is provided by the patient. No language interpreter was used.  Shortness of Breath      Past Medical History:  Diagnosis Date  . Diabetes mellitus without complication ([place])   . Hypertension     Patient Active Problem List   Diagnosis Date Noted  . Diabetes mellitus due to underlying condition without complications ([place]) 24/58/0998  . Essential hypertension, benign 08/04/2014    Past Surgical History:  Procedure Laterality Date  . COLONOSCOPY         Family History  Problem Relation Age of Onset  . Hypertension Mother   . Diabetes Mother   . Cancer Maternal Grandmother     Social History   Tobacco Use  . Smoking status: Never Smoker  Substance Use Topics  . Alcohol use: No  . Drug use: No    Home Medications Prior to Admission medications   Medication Sig Start Date End Date Taking? Authorizing Provider  atenolol (TENORMIN) 50 MG tablet Take 1 tablet (50 mg total) by mouth daily. 08/04/14   Lorayne Marek, MD  glucose monitoring kit (FREESTYLE) monitoring kit 1 each by Does not apply route as needed for other. Dispense any model that is covered- dispense testing supplies for Q AC/ HS accuchecks- 1 month supply with one refil. 08/04/14   Lorayne Marek, MD  hydrochlorothiazide (HYDRODIURIL) 25 MG tablet Take 1 tablet (25 mg total) by mouth daily. 08/04/14   Lorayne Marek, MD  HYDROcodone-acetaminophen  (NORCO/VICODIN) 5-325 MG per tablet Take 1 tablet by mouth every 4 (four) hours as needed for pain. 08/08/13   Amparo [place], MD  sitaGLIPtin-metformin (JANUMET) 50-1000 MG per tablet Take 1 tablet by mouth 2 (two) times daily with a meal. 08/12/14   Lorayne Marek, MD    Allergies    Patient has no known allergies.  Review of Systems   Review of Systems  Respiratory: Positive for shortness of breath.     Physical Exam Updated Vital Signs BP (!) 126/98 (BP Location: Left Arm)   Pulse 77   Temp 99.2 F (37.3 C) (Oral)   Resp 16   SpO2 95%   Physical Exam  ED Results / Procedures / Treatments   Labs (all labs ordered are listed, but only abnormal results are displayed) Labs Reviewed  BASIC METABOLIC PANEL - Abnormal; Notable for the following components:      Result Value   Sodium 127 (*)    Chloride 92 (*)    Glucose, Bld 280 (*)    Calcium 8.0 (*)    All other components within normal limits  CBC  TROPONIN I (HIGH SENSITIVITY)  TROPONIN I (HIGH SENSITIVITY)    EKG EKG Interpretation  Date/Time:  Thursday February 13 2020 20:13:19 EDT Ventricular Rate:  94 PR Interval:  130 QRS Duration: 92 QT Interval:  380 QTC Calculation: 475 R Axis:   -20 Text Interpretation: Normal sinus rhythm Minimal voltage  criteria for LVH, may be normal variant ( R in aVL ) Nonspecific T wave abnormality Prolonged QT Abnormal ECG No old tracing to compare Confirmed by Ward, Cyril Mourning [phone]) on 02/14/2020 12:50:00 AM   Radiology DG Chest 2 View  Result Date: 02/13/2020 CLINICAL DATA:  Shortness of breath EXAM: CHEST - 2 VIEW COMPARISON:  09/18/2009 FINDINGS: Patchy bilateral airspace opacities compatible with pneumonia. Heart is normal size. No effusions or acute bony abnormality. IMPRESSION: Patchy bilateral airspace opacities compatible with multifocal pneumonia, likely COVID pneumonia. Electronically Signed   By: Rolm Baptise M.D.   On: 02/13/2020 20:37    Procedures Procedures (including  critical care time)  Medications Ordered in ED Medications  sodium chloride flush (NS) 0.9 % injection 3 mL (has no administration in time range)  albuterol (VENTOLIN HFA) 108 (90 Base) MCG/ACT inhaler 4 puff (4 puffs Inhalation Given 02/14/20 0216)    ED Course  I have reviewed the triage vital signs and the nursing notes.  Pertinent labs & imaging results that were available during my care of the patient were reviewed by me and considered in my medical decision making (see chart for details).  Patient to ED with SOB after diagnosis of COVID yesterday. No fever at home, no chest pain.   He is resting comfortably. Tachypneic but denies feeling SOB at rest. He appears to have trouble speaking full sentences with breaking to take breaths. O2 saturation on initial exam is 93-95%. Over time under observation, he remains tachypneic with O2 sats decreasing to 88-91%. CXR c/w COVID with patchy bilateral infiltrates. No tachycardia. Na 127, with corrected value of 131 given hyperglycemia.   3:00 - on re-check, patient is sleeping with monitored oxygenation of 87%. Tachypneic while sleeping. He is put on 2L of oxygen.   3:15 - Patient's oxygen level still 89% on 2L. This is taken to 3L with improvement to 91%. Patient will need to be admitted for further management. Hospitalist paged.  **Positive COVID test at [facility] in [place], not repeated in the ED.   I talked to his wife with the patient's permission. She relates that he is not wanting to eat or drink at home. That he has coughing spells that cause his to be SOB. She is updated as to his condition and plan to admit.    MDM Rules/Calculators/A&P                      1. COVID 2. Hypoxia 3. Dyspnea  Final Clinical Impression(s) / ED Diagnoses Final diagnoses:  None    Rx / DC Orders ED Discharge Orders    None       Charlann Lange, PA-C 02/14/20 0329    Charlann Lange, PA-C 02/14/20 0332    Ward, Delice Bison, DO  02/14/20 [phone]

## 2020-02-14 NOTE — Progress Notes (Signed)
Patient admitted early morning hours by nighttime hospitalist. 59 year old gentleman with history of type 2 diabetes on oral hypoglycemics at home, hypertension who has been having symptoms of cough and fatigue with low-grade fever for about 5 days, went to urgent care 3 days ago and diagnosed with COVID-19 infection continue to feel short of breath and fatigue so came to the ER.  Had occasional diarrhea. In the emergency room, patient is still requiring 3 L of oxygen.  Chest x-ray shows multifocal pneumonia.  Sodium 127 with corrected sodium more than 130.  Patient was admitted with acute respiratory failure secondary to COVID-19 infection.  I examined patient in the emergency room, he already felt somehow better than last night.  He still on 3 L of oxygen.  Acute hypoxemic respiratory failure secondary to COVID-19 pneumonia: Continue to monitor due to significant symptoms  chest physiotherapy, incentive spirometry, deep breathing exercises, sputum induction, mucolytic's and bronchodilators. Supplemental oxygen to keep saturations more than 90%. Covid directed therapy with , steroids, on dexamethasone remdesivir, day 2/5 Due to severity of symptoms, patient will need daily inflammatory markers, liver function test to monitor and direct COVID-19 therapies.  Actemra: I also discussed with him that there is 1 more modality of treatment with giving a medicine called Actemra. Commonly known as rheumatoid arthritis drug.  Different  studies have shown different results, recent studies have shown some benefits if used early.  This may or may not benefit in your case.  Discussed different side effects including secondary bacterial infections, reactivation of tuberculosis or hepatitis.  This patient does not have any absolute contraindication to receive Tocilizumab. patient and patient party aware about the disease and agreeable to use this experimental medicine as risks outweights potential survival  benefit.  I will give him 1 dose of Actemra as his CRP is more than 15.  Add long-acting insulin along with sliding scale insulin. Discontinue IV fluids, he is taking oral medications well.

## 2020-02-14 NOTE — ED Notes (Signed)
Call the wife at  [phone]

## 2020-02-14 NOTE — H&P (Signed)
History and Physical    Miguel Valdez UYQ:[id number] DOB: 1960-12-28 DOA: 02/13/2020  PCP: Loyola Mast, PA-C  Patient coming from: Home.  Chief Complaint: Shortness of breath.  HPI: Miguel Valdez is a 59 y.o. male with history of diabetes mellitus type 2, hypertension has been experiencing shortness of breath nonproductive cough and fatigue and subjective feeling of fever and chills over the last 3 days and was diagnosed with COVID-19 infection 3 days ago results of which are available in care everywhere.  Since then patient has become more short of breath and fatigued and decided to come to the ER.  Denies chest pain.  Had some occasional diarrhea.  ED Course: In the ER patient is requiring 3 L oxygen which is new.  Chest x-ray shows multifocal pneumonia.  Labs show hyponatremia 127 blood glucose 280 high sensitive troponin was negative CBC unremarkable EKG shows normal sinus rhythm.  Patient admitted for acute respiratory failure with hypoxia secondary to Covid infection.  Review of Systems: As per HPI, rest all negative.   Past Medical History:  Diagnosis Date  . Diabetes mellitus without complication ([place])   . Hypertension     Past Surgical History:  Procedure Laterality Date  . COLONOSCOPY       reports that he has never smoked. He has never used smokeless tobacco. He reports that he does not drink alcohol or use drugs.  No Known Allergies  Family History  Problem Relation Age of Onset  . Hypertension Mother   . Diabetes Mother   . Cancer Maternal Grandmother     Prior to Admission medications   Medication Sig Start Date End Date Taking? Authorizing Provider  acetaminophen (TYLENOL) 500 MG tablet Take 1,000 mg by mouth every 6 (six) hours as needed (for fever and/or pain).   Yes [provider]  aspirin EC 81 MG tablet Take 81 mg by mouth at bedtime.   Yes [provider]  NON FORMULARY Take 1 tablet by mouth See admin instructions. Neurorubine forte  Lactab- Take 1 tablet by mouth once a day for neuropathic pain   Yes [provider]  NON FORMULARY Take 5 mg by mouth See admin instructions. Concor 5 mg tablets: Take 1 tablet by mouth once a day   Yes [provider]  sitaGLIPtin-metformin (JANUMET) 50-1000 MG per tablet Take 1 tablet by mouth 2 (two) times daily with a meal. 08/12/14  Yes Advani, Deepak, MD  atenolol (TENORMIN) 50 MG tablet Take 1 tablet (50 mg total) by mouth daily. Patient not taking: Reported on 02/14/2020 08/04/14   Lorayne Marek, MD  glucose monitoring kit (FREESTYLE) monitoring kit 1 each by Does not apply route as needed for other. Dispense any model that is covered- dispense testing supplies for Q AC/ HS accuchecks- 1 month supply with one refil. 08/04/14   Lorayne Marek, MD  hydrochlorothiazide (HYDRODIURIL) 25 MG tablet Take 1 tablet (25 mg total) by mouth daily. Patient not taking: Reported on 02/14/2020 08/04/14   Lorayne Marek, MD  HYDROcodone-acetaminophen (NORCO/VICODIN) 5-325 MG per tablet Take 1 tablet by mouth every 4 (four) hours as needed for pain. Patient not taking: Reported on 02/14/2020 08/08/13   Amparo [place], MD    Physical Exam: Constitutional: Moderately built and nourished. Vitals:   02/13/20 1940 02/14/20 0310 02/14/20 0313 02/14/20 0314  BP: (!) 126/98  (!) 122/91   Pulse: 77  96   Resp: 16  (!) 28   Temp: 99.2 F (37.3 C)  99.1 F (37.3 C) 99.1 F (37.3 C)  TempSrc: Oral  Oral Oral  SpO2: 95% 93% 93%    Eyes: Anicteric no pallor. ENMT: No discharge from the ears eyes nose or mouth. Neck: No mass felt.  No neck rigidity. Respiratory: No rhonchi or crepitations. Cardiovascular: S1-S2 heard. Abdomen: Soft nontender bowel sounds present. Musculoskeletal: No edema. Skin: No rash. Neurologic: Alert awake oriented to time place and person.  Moves all extremities. Psychiatric: Appears normal with normal affect.   Labs on Admission: I have personally reviewed following labs  and imaging studies  CBC: Recent Labs  Lab 02/13/20 1954  WBC 4.5  HGB 14.8  HCT 45.3  MCV 81.9  PLT 297   Basic Metabolic Panel: Recent Labs  Lab 02/13/20 1954  NA 127*  K 4.1  CL 92*  CO2 23  GLUCOSE 280*  BUN 8  CREATININE 1.11  CALCIUM 8.0*   GFR: CrCl cannot be calculated (Unknown ideal weight.). Liver Function Tests: No results for input(s): AST, ALT, ALKPHOS, BILITOT, PROT, ALBUMIN in the last 168 hours. No results for input(s): LIPASE, AMYLASE in the last 168 hours. No results for input(s): AMMONIA in the last 168 hours. Coagulation Profile: No results for input(s): INR, PROTIME in the last 168 hours. Cardiac Enzymes: No results for input(s): CKTOTAL, CKMB, CKMBINDEX, TROPONINI in the last 168 hours. BNP (last 3 results) No results for input(s): PROBNP in the last 8760 hours. HbA1C: No results for input(s): HGBA1C in the last 72 hours. CBG: No results for input(s): GLUCAP in the last 168 hours. Lipid Profile: No results for input(s): CHOL, HDL, LDLCALC, TRIG, CHOLHDL, LDLDIRECT in the last 72 hours. Thyroid Function Tests: No results for input(s): TSH, T4TOTAL, FREET4, T3FREE, THYROIDAB in the last 72 hours. Anemia Panel: No results for input(s): VITAMINB12, FOLATE, FERRITIN, TIBC, IRON, RETICCTPCT in the last 72 hours. Urine analysis: No results found for: COLORURINE, APPEARANCEUR, LABSPEC, PHURINE, GLUCOSEU, HGBUR, BILIRUBINUR, KETONESUR, PROTEINUR, UROBILINOGEN, NITRITE, LEUKOCYTESUR Sepsis Labs: @LABRCNTIP (procalcitonin:4,lacticidven:4) )No results found for this or any previous visit (from the past 240 hour(s)).   Radiological Exams on Admission: DG Chest 2 View  Result Date: 02/13/2020 CLINICAL DATA:  Shortness of breath EXAM: CHEST - 2 VIEW COMPARISON:  09/18/2009 FINDINGS: Patchy bilateral airspace opacities compatible with pneumonia. Heart is normal size. No effusions or acute bony abnormality. IMPRESSION: Patchy bilateral airspace opacities  compatible with multifocal pneumonia, likely COVID pneumonia. Electronically Signed   By: Rolm Baptise M.D.   On: 02/13/2020 20:37    EKG: Independently reviewed.  Normal sinus rhythm.  Assessment/Plan Principal Problem:   Acute respiratory failure due to COVID-19 [facility]) Active Problems:   Diabetes mellitus due to underlying condition without complications ([facility])   Essential hypertension, benign    1. Acute respiratory failure with hypoxia secondary to COVID-19 infection for which I have started patient on remdesivir and Decadron.  Will check inflammatory markers and closely observe. 2. Diabetes mellitus type 2 on sliding scale coverage for now.  Note that patient is on Decadron and may need to closely follow CBGs while patient is on Decadron. 3. Hypertension on bisoprolol. 4. Hyponatremia likely from poor oral intake and dehydration.  Repeat metabolic panel.  Check urine studies.  If not improving then will need further work-up.  Given that patient has acute respiratory failure with Covid infection will need close monitoring for any further deterioration in inpatient status.   DVT prophylaxis: Lovenox. Code Status: Full code. Family Communication: Discussed with patient. Disposition Plan: Home.  Consults called: None. Admission status: Inpatient.   Rise Patience MD [facility] Pager [phone].  If 7PM-7AM, please contact night-coverage [email or web address] Password [email or web address]  02/14/2020, 5:18 AM

## 2020-02-15 LAB — COMPREHENSIVE METABOLIC PANEL
ALT: 28 U/L (ref 0–44)
AST: 34 U/L (ref 15–41)
Albumin: 2.5 g/dL — ABNORMAL LOW (ref 3.5–5.0)
Alkaline Phosphatase: 63 U/L (ref 38–126)
Anion gap: 11 (ref 5–15)
BUN: 16 mg/dL (ref 6–20)
CO2: 26 mmol/L (ref 22–32)
Calcium: 8.5 mg/dL — ABNORMAL LOW (ref 8.9–10.3)
Chloride: 95 mmol/L — ABNORMAL LOW (ref 98–111)
Creatinine, Ser: 0.98 mg/dL (ref 0.61–1.24)
GFR calc Af Amer: >60 mL/min (ref >60)
GFR calc non Af Amer: >60 mL/min (ref >60)
Glucose, Bld: 296 mg/dL — ABNORMAL HIGH (ref 70–99)
Potassium: 4.8 mmol/L (ref 3.5–5.1)
Sodium: 132 mmol/L — ABNORMAL LOW (ref 135–145)
Total Bilirubin: 0.4 mg/dL (ref 0.3–1.2)
Total Protein: 7.4 g/dL (ref 6.5–8.1)

## 2020-02-15 LAB — CBC WITH DIFFERENTIAL/PLATELET
Abs Immature Granulocytes: 0.03 10*3/uL (ref 0.00–0.07)
Basophils Absolute: 0 10*3/uL (ref 0.0–0.1)
Basophils Relative: 0 %
Eosinophils Absolute: 0 10*3/uL (ref 0.0–0.5)
Eosinophils Relative: 0 %
HCT: 43.8 % (ref 39.0–52.0)
Hemoglobin: 14.3 g/dL (ref 13.0–17.0)
Immature Granulocytes: 1 %
Lymphocytes Relative: 11 %
Lymphs Abs: 0.6 10*3/uL — ABNORMAL LOW (ref 0.7–4.0)
MCH: 26.4 pg (ref 26.0–34.0)
MCHC: 32.6 g/dL (ref 30.0–36.0)
MCV: 80.8 fL (ref 80.0–100.0)
Monocytes Absolute: 0.3 10*3/uL (ref 0.1–1.0)
Monocytes Relative: 6 %
Neutro Abs: 4.4 10*3/uL (ref 1.7–7.7)
Neutrophils Relative %: 82 %
Platelets: 190 10*3/uL (ref 150–400)
RBC: 5.42 MIL/uL (ref 4.22–5.81)
RDW: 14.6 % (ref 11.5–15.5)
WBC: 5.4 10*3/uL (ref 4.0–10.5)
nRBC: 0 % (ref 0.0–0.2)

## 2020-02-15 LAB — MAGNESIUM: Magnesium: 2.1 mg/dL (ref 1.7–2.4)

## 2020-02-15 LAB — LACTATE DEHYDROGENASE: LDH: 293 U/L — ABNORMAL HIGH (ref 98–192)

## 2020-02-15 LAB — FERRITIN: Ferritin: 437 ng/mL — ABNORMAL HIGH (ref 24–336)

## 2020-02-15 LAB — C-REACTIVE PROTEIN: CRP: 12.3 mg/dL — ABNORMAL HIGH (ref <1.0)

## 2020-02-15 LAB — PHOSPHORUS: Phosphorus: 2.8 mg/dL (ref 2.5–4.6)

## 2020-02-15 LAB — GLUCOSE, CAPILLARY
Glucose-Capillary: 286 mg/dL — ABNORMAL HIGH (ref 70–99)
Glucose-Capillary: 358 mg/dL — ABNORMAL HIGH (ref 70–99)
Glucose-Capillary: 339 mg/dL — ABNORMAL HIGH (ref 70–99)
Glucose-Capillary: 254 mg/dL — ABNORMAL HIGH (ref 70–99)

## 2020-02-15 LAB — D-DIMER, QUANTITATIVE: D-Dimer, Quant: 1.59 ug/mL-FEU — ABNORMAL HIGH (ref 0.00–0.50)

## 2020-02-15 MED ORDER — INSULIN ASPART 100 UNIT/ML ~~LOC~~ SOLN
4.0000 [IU] | Freq: Three times a day (TID) | SUBCUTANEOUS | Status: DC
  Administered 2020-02-15 – 2020-02-18 (×9): 4 [IU] via SUBCUTANEOUS

## 2020-02-15 MED ORDER — INSULIN DETEMIR 100 UNIT/ML ~~LOC~~ SOLN
30.0000 [IU] | Freq: Every day | SUBCUTANEOUS | Status: DC
  Administered 2020-02-15 – 2020-02-17 (×3): 30 [IU] via SUBCUTANEOUS
  Filled 2020-02-15 (×4): qty 0.3

## 2020-02-15 MED ORDER — ENOXAPARIN SODIUM 60 MG/0.6ML ~~LOC~~ SOLN
55.0000 mg | Freq: Every day | SUBCUTANEOUS | Status: DC
  Administered 2020-02-16 – 2020-02-17 (×2): 55 mg via SUBCUTANEOUS
  Filled 2020-02-15 (×2): qty 0.6

## 2020-02-15 MED ORDER — INSULIN ASPART 100 UNIT/ML ~~LOC~~ SOLN
0.0000 [IU] | Freq: Every day | SUBCUTANEOUS | Status: DC
  Administered 2020-02-15: 22:00:00 3 [IU] via SUBCUTANEOUS
  Administered 2020-02-16: 22:00:00 2 [IU] via SUBCUTANEOUS
  Administered 2020-02-17: 21:00:00 3 [IU] via SUBCUTANEOUS

## 2020-02-15 MED ORDER — LACTATED RINGERS IV SOLN: INTRAVENOUS | Status: AC

## 2020-02-15 MED ORDER — INSULIN ASPART 100 UNIT/ML ~~LOC~~ SOLN
0.0000 [IU] | Freq: Three times a day (TID) | SUBCUTANEOUS | Status: DC
  Administered 2020-02-15: 11 [IU] via SUBCUTANEOUS
  Administered 2020-02-16 (×2): 5 [IU] via SUBCUTANEOUS
  Administered 2020-02-16: 09:00:00 2 [IU] via SUBCUTANEOUS
  Administered 2020-02-17: 11 [IU] via SUBCUTANEOUS
  Administered 2020-02-17: 3 [IU] via SUBCUTANEOUS
  Administered 2020-02-17: 17:00:00 8 [IU] via SUBCUTANEOUS
  Administered 2020-02-18: 08:00:00 3 [IU] via SUBCUTANEOUS

## 2020-02-15 NOTE — Progress Notes (Signed)
Pt's wife requesting that pt's niece - Manal: [phone] be the point of contact for pt.

## 2020-02-15 NOTE — Progress Notes (Signed)
PROGRESS NOTE                                                                                                                                                                                                             Patient Demographics:    Miguel Valdez, is a 59 y.o. male, DOB - 21-Jan-1961, LFY:[id number]  Outpatient Primary MD for the patient is Annye English    LOS - 1  Admit date - 02/13/2020    Chief Complaint  Patient presents with  . Shortness of Breath       Brief Narrative  - 59 year old gentleman with history of type 2 diabetes on oral hypoglycemics at home, hypertension who has been having symptoms of cough and fatigue with low-grade fever for about 5 days, went to urgent care 3 days ago and diagnosed with COVID-19 infection continue to feel short of breath and fatigue so came to the ER he was diagnosed with acute hypoxic respiratory failure due to COVID-19 infection requiring oxygen and was admitted to the hospital.   Subjective:    [name] today has, No headache, No chest pain, No abdominal pain - No Nausea, No new weakness tingling or numbness, improve Cough - SOB.    Assessment  & Plan :     1. Acute Hypoxic Resp. Failure due to Acute Covid 19 Viral Pneumonitis during the ongoing 2020 Covid 19 Pandemic - he severe disease and was treated appropriately with combination of IV steroids, remdesivir and Actemra.  Hypoxia is improving and clinically looks better, continue to monitor clinically along with inflammatory markers.  Encouraged the patient to sit up in chair in the daytime use I-S and flutter valve for pulmonary toiletry and then prone in bed when at night.  Will advance activity and titrate down oxygen as possible.   SpO2: 92 % O2 Flow Rate (L/min): 4 L/min  Recent Labs  Lab 02/14/20 0516 02/14/20 0615 02/14/20 0849 02/15/20 0327  CRP  --  15.1*  --  12.3*  DDIMER  --  1.13*  --  1.59*    BNP 15.8  --   --   --   PROCALCITON  --   --  0.11  --     Hepatic Function Latest Ref Rng & Units 02/15/2020 02/14/2020 08/11/2014  Total Protein 6.5 - 8.1 g/dL  7.4 7.4 8.0  Albumin 3.5 - 5.0 g/dL 2.5(L) 2.7(L) 3.6  AST 15 - 41 U/L 34 63(H) 16  ALT 0 - 44 U/L 28 34 20  Alk Phosphatase 38 - 126 U/L 63 75 82  Total Bilirubin 0.3 - 1.2 mg/dL 0.4 1.6(H) 0.5    2.  Dehydration with hyponatremia and hypotension.  Hydrate with IV fluids and monitor.  Hold beta-blocker.  3.  DM type II.  On Levemir and sliding scale, increase Levemir dose and add Premeal NovoLog for better use with ongoing steroid use.  Lab Results  Component Value Date   HGBA1C 7.5% 08/04/2014    CBG (last 3)  Recent Labs    02/14/20 2118 02/15/20 0747 02/15/20 1132  GLUCAP 312* 286* 358*         Condition - Fair  Family Communication  : Needs on 02/15/2020 [phone]   Code Status :  Full  Diet :   Diet Order            Diet heart healthy/carb modified Room service appropriate? Yes; Fluid consistency: Thin  Diet effective now               Disposition Plan  : Stay in the hospital and finished treatment for COVID-19 pneumonia with acute hypoxic respiratory failure requiring oxygen  Consults  :  None  Procedures  :  None  PUD Prophylaxis :   DVT Prophylaxis  :  Lovenox   Lab Results  Component Value Date   PLT 190 02/15/2020    Inpatient Medications  Scheduled Meds: . aspirin EC  81 mg Oral QHS  . bisoprolol  5 mg Oral Daily  . dexamethasone (DECADRON) injection  6 mg Intravenous Q24H  . [START ON 02/16/2020] enoxaparin (LOVENOX) injection  55 mg Subcutaneous Daily  . insulin aspart  0-9 Units Subcutaneous TID WC  . insulin detemir  10 Units Subcutaneous BID  . sodium chloride flush  3 mL Intravenous Once   Continuous Infusions: . lactated ringers 100 mL/hr at 02/15/20 1016  . remdesivir 100 mg in NS 100 mL 100 mg (02/15/20 0907)   PRN Meds:.benzonatate, [DISCONTINUED]  ondansetron **OR** ondansetron (ZOFRAN) IV  Antibiotics  :    Anti-infectives (From admission, onward)   Start     Dose/Rate Route Frequency Ordered Stop   02/15/20 1000  remdesivir 100 mg in sodium chloride 0.9 % 100 mL IVPB  Status:  Discontinued     100 mg 200 mL/hr over 30 Minutes Intravenous Daily 02/14/20 0519 02/14/20 0533   02/15/20 1000  remdesivir 100 mg in sodium chloride 0.9 % 100 mL IVPB     100 mg 200 mL/hr over 30 Minutes Intravenous Daily 02/14/20 0533 02/19/20 0959   02/14/20 0600  remdesivir 100 mg in sodium chloride 0.9 % 100 mL IVPB     100 mg 200 mL/hr over 30 Minutes Intravenous Every 30 min 02/14/20 0533 02/14/20 0707   02/14/20 0530  remdesivir 200 mg in sodium chloride 0.9% 250 mL IVPB  Status:  Discontinued     200 mg 580 mL/hr over 30 Minutes Intravenous Once 02/14/20 0519 02/14/20 0533       Time Spent in minutes  30   Lala Lund M.D on 02/15/2020 at 12:29 PM  To page go to [email or web address] - password [facility]  [facility] -  Office  [phone]  See all Orders from today for further details    Objective:   Vitals:   02/14/20 1218 02/14/20  1600 02/14/20 1952 02/15/20 0502  BP: 109/77 (!) 121/91 105/61 105/65  Pulse: 83 80 70 67  Resp: 20 (!) _0 Temp: 98.3 F (36.8 C) 98.1 F (36.7 C) 97.8 F (36.6 C) 97.8 F (36.6 C)  TempSrc: Oral Oral Oral Oral  SpO2: 90% (!) 87% 91% 92%  Weight: 115.7 kg     Height: _1  (1.778 m)       Wt Readings from Last 3 Encounters:  02/14/20 115.7 kg  08/04/14 108.7 kg     Intake/Output Summary (Last 24 hours) at 02/15/2020 1229 Last data filed at 02/15/2020 0532 Gross per 24 hour  Intake 250 ml  Output 1050 ml  Net -800 ml     Physical Exam  Awake Alert, No new F.N deficits, Normal affect [place].AT,PERRAL Supple Neck,No JVD, No cervical lymphadenopathy appriciated.  Symmetrical Chest wall movement, Good air movement bilaterally, CTAB RRR,No Gallops,Rubs or new Murmurs, No Parasternal  Heave +ve B.Sounds, Abd Soft, No tenderness, No organomegaly appriciated, No rebound - guarding or rigidity. No Cyanosis, Clubbing or edema, No new Rash or bruise     Data Review:    CBC Recent Labs  Lab 02/13/20 1954 02/14/20 0615 02/15/20 0327  WBC 4.5 7.0 5.4  HGB 14.8 14.8 14.3  HCT 45.3 45.4 43.8  PLT 177 160 190  MCV 81.9 81.5 80.8  MCH 26.8 26.6 26.4  MCHC 32.7 32.6 32.6  RDW 14.6 14.7 14.6  LYMPHSABS  --  0.5* 0.6*  MONOABS  --  0.2 0.3  EOSABS  --  0.0 0.0  BASOSABS  --  0.0 0.0    Chemistries  Recent Labs  Lab 02/13/20 1954 02/14/20 0615 02/15/20 0327  NA 127* 127* 132*  K 4.1 5.1 4.8  CL 92* 92* 95*  CO2 23 20* 26  GLUCOSE 280* 320* 296*  BUN _2 CREATININE 1.11 1.06 0.98  CALCIUM 8.0* 8.0* 8.5*  AST  --  63* 34  ALT  --  34 28  ALKPHOS  --  75 63  BILITOT  --  1.6* 0.4  MG  --   --  2.1     ------------------------------------------------------------------------------------------------------------------ No results for input(s): CHOL, HDL, LDLCALC, TRIG, CHOLHDL, LDLDIRECT in the last 72 hours.  Lab Results  Component Value Date   HGBA1C 7.5% 08/04/2014   ------------------------------------------------------------------------------------------------------------------ No results for input(s): TSH, T4TOTAL, T3FREE, THYROIDAB in the last 72 hours.  Invalid input(s): FREET3  Cardiac Enzymes No results for input(s): CKMB, TROPONINI, MYOGLOBIN in the last 168 hours.  Invalid input(s): CK ------------------------------------------------------------------------------------------------------------------    Component Value Date/Time   BNP 15.8 02/14/2020 0516    Micro Results No results found for this or any previous visit (from the past 240 hour(s)).  Radiology Reports DG Chest 2 View  Result Date: 02/13/2020 CLINICAL DATA:  Shortness of breath EXAM: CHEST - 2 VIEW COMPARISON:  09/18/2009 FINDINGS: Patchy bilateral airspace opacities  compatible with pneumonia. Heart is normal size. No effusions or acute bony abnormality. IMPRESSION: Patchy bilateral airspace opacities compatible with multifocal pneumonia, likely COVID pneumonia. Electronically Signed   By: Rolm Baptise M.D.   On: 02/13/2020 20:37

## 2020-02-16 LAB — CBC WITH DIFFERENTIAL/PLATELET
Abs Immature Granulocytes: 0.04 10*3/uL (ref 0.00–0.07)
Basophils Absolute: 0 10*3/uL (ref 0.0–0.1)
Basophils Relative: 0 %
Eosinophils Absolute: 0 10*3/uL (ref 0.0–0.5)
Eosinophils Relative: 0 %
HCT: 43.1 % (ref 39.0–52.0)
Hemoglobin: 14 g/dL (ref 13.0–17.0)
Immature Granulocytes: 1 %
Lymphocytes Relative: 14 %
Lymphs Abs: 0.8 10*3/uL (ref 0.7–4.0)
MCH: 26.7 pg (ref 26.0–34.0)
MCHC: 32.5 g/dL (ref 30.0–36.0)
MCV: 82.1 fL (ref 80.0–100.0)
Monocytes Absolute: 0.5 10*3/uL (ref 0.1–1.0)
Monocytes Relative: 9 %
Neutro Abs: 4.3 10*3/uL (ref 1.7–7.7)
Neutrophils Relative %: 76 %
Platelets: 239 10*3/uL (ref 150–400)
RBC: 5.25 MIL/uL (ref 4.22–5.81)
RDW: 14.8 % (ref 11.5–15.5)
WBC: 5.7 10*3/uL (ref 4.0–10.5)
nRBC: 0 % (ref 0.0–0.2)

## 2020-02-16 LAB — GLUCOSE, CAPILLARY
Glucose-Capillary: 145 mg/dL — ABNORMAL HIGH (ref 70–99)
Glucose-Capillary: 209 mg/dL — ABNORMAL HIGH (ref 70–99)
Glucose-Capillary: 229 mg/dL — ABNORMAL HIGH (ref 70–99)
Glucose-Capillary: 252 mg/dL — ABNORMAL HIGH (ref 70–99)

## 2020-02-16 LAB — COMPREHENSIVE METABOLIC PANEL
ALT: 26 U/L (ref 0–44)
AST: 30 U/L (ref 15–41)
Albumin: 2.3 g/dL — ABNORMAL LOW (ref 3.5–5.0)
Alkaline Phosphatase: 57 U/L (ref 38–126)
Anion gap: 11 (ref 5–15)
BUN: 14 mg/dL (ref 6–20)
CO2: 26 mmol/L (ref 22–32)
Calcium: 8.6 mg/dL — ABNORMAL LOW (ref 8.9–10.3)
Chloride: 100 mmol/L (ref 98–111)
Creatinine, Ser: 0.87 mg/dL (ref 0.61–1.24)
GFR calc Af Amer: >60 mL/min (ref >60)
GFR calc non Af Amer: >60 mL/min (ref >60)
Glucose, Bld: 165 mg/dL — ABNORMAL HIGH (ref 70–99)
Potassium: 4.4 mmol/L (ref 3.5–5.1)
Sodium: 137 mmol/L (ref 135–145)
Total Bilirubin: 0.4 mg/dL (ref 0.3–1.2)
Total Protein: 6.9 g/dL (ref 6.5–8.1)

## 2020-02-16 LAB — MAGNESIUM: Magnesium: 1.9 mg/dL (ref 1.7–2.4)

## 2020-02-16 LAB — D-DIMER, QUANTITATIVE: D-Dimer, Quant: 0.71 ug/mL-FEU — ABNORMAL HIGH (ref 0.00–0.50)

## 2020-02-16 LAB — C-REACTIVE PROTEIN: CRP: 5.7 mg/dL — ABNORMAL HIGH (ref <1.0)

## 2020-02-16 LAB — BRAIN NATRIURETIC PEPTIDE: B Natriuretic Peptide: 87.5 pg/mL (ref 0.0–100.0)

## 2020-02-16 NOTE — Progress Notes (Signed)
PROGRESS NOTE                                                                                                                                                                                                             Patient Demographics:    Miguel Valdez, is a 59 y.o. male, DOB - Jun 24, 1961, TKK:[id number]  Outpatient Primary MD for the patient is Annye English    LOS - 2  Admit date - 02/13/2020    Chief Complaint  Patient presents with  . Shortness of Breath       Brief Narrative  - 59 year old gentleman with history of type 2 diabetes on oral hypoglycemics at home, hypertension who has been having symptoms of cough and fatigue with low-grade fever for about 5 days, went to urgent care 3 days ago and diagnosed with COVID-19 infection continue to feel short of breath and fatigue so came to the ER he was diagnosed with acute hypoxic respiratory failure due to COVID-19 infection requiring oxygen and was admitted to the hospital.   Subjective:   Patient in bed, appears comfortable, denies any headache, no fever, no chest pain or pressure, much improved shortness of breath , no abdominal pain. No focal weakness.   Assessment  & Plan :     1. Acute Hypoxic Resp. Failure due to Acute Covid 19 Viral Pneumonitis during the ongoing 2020 Covid 19 Pandemic - he severe disease and was treated appropriately with combination of IV steroids, remdesivir and Actemra.  Hypoxia is improving and clinically looks better, continue to monitor clinically along with inflammatory markers.  Encouraged the patient to sit up in chair in the daytime use I-S and flutter valve for pulmonary toiletry and then prone in bed when at night.  Will advance activity and titrate down oxygen as possible.   SpO2: 90 % O2 Flow Rate (L/min): 1 L/min  Recent Labs  Lab 02/14/20 0516 02/14/20 0615 02/14/20 0849 02/15/20 0327 02/16/20 0318  CRP  --  15.1*   --  12.3* 5.7*  DDIMER  --  1.13*  --  1.59* 0.71*  BNP 15.8  --   --   --  87.5  PROCALCITON  --   --  0.11  --   --     Hepatic Function Latest Ref Rng & Units 02/16/2020 02/15/2020 02/14/2020  Total Protein 6.5 - 8.1 g/dL 6.9 7.4 7.4  Albumin 3.5 - 5.0 g/dL 2.3(L) 2.5(L) 2.7(L)  AST 15 - 41 U/L 30 34 63(H)  ALT 0 - 44 U/L 26 28 34  Alk Phosphatase 38 - 126 U/L 57 63 75  Total Bilirubin 0.3 - 1.2 mg/dL 0.4 0.4 1.6(H)    2.  Dehydration with hyponatremia and hypotension.  Resolved after IV fluids.  3.  DM type II.  On Levemir and sliding scale, increase Levemir dose and add Premeal NovoLog for better use with ongoing steroid use.  Lab Results  Component Value Date   HGBA1C 7.5% 08/04/2014    CBG (last 3)  Recent Labs    02/15/20 2128 02/16/20 0740 02/16/20 1129  GLUCAP 254* 145* 209*     Condition - Fair  Family Communication  : His niece on 02/15/2020 - [phone]   Code Status :  Full  Diet :   Diet Order            Diet heart healthy/carb modified Room service appropriate? Yes; Fluid consistency: Thin  Diet effective now               Disposition Plan  : Stay in the hospital and finished treatment for COVID-19 pneumonia with acute hypoxic respiratory failure requiring oxygen  Consults  :  None  Procedures  :  None  PUD Prophylaxis :   DVT Prophylaxis  :  Lovenox   Lab Results  Component Value Date   PLT 239 02/16/2020    Inpatient Medications  Scheduled Meds: . aspirin EC  81 mg Oral QHS  . dexamethasone (DECADRON) injection  6 mg Intravenous Q24H  . enoxaparin (LOVENOX) injection  55 mg Subcutaneous Daily  . insulin aspart  0-15 Units Subcutaneous TID WC  . insulin aspart  0-5 Units Subcutaneous QHS  . insulin aspart  4 Units Subcutaneous TID WC  . insulin detemir  30 Units Subcutaneous Daily  . sodium chloride flush  3 mL Intravenous Once   Continuous Infusions: . remdesivir 100 mg in NS 100 mL 100 mg (02/16/20 0929)   PRN  Meds:.benzonatate, [DISCONTINUED] ondansetron **OR** ondansetron (ZOFRAN) IV  Antibiotics  :    Anti-infectives (From admission, onward)   Start     Dose/Rate Route Frequency Ordered Stop   02/15/20 1000  remdesivir 100 mg in sodium chloride 0.9 % 100 mL IVPB  Status:  Discontinued     100 mg 200 mL/hr over 30 Minutes Intravenous Daily 02/14/20 0519 02/14/20 0533   02/15/20 1000  remdesivir 100 mg in sodium chloride 0.9 % 100 mL IVPB     100 mg 200 mL/hr over 30 Minutes Intravenous Daily 02/14/20 0533 02/19/20 0959   02/14/20 0600  remdesivir 100 mg in sodium chloride 0.9 % 100 mL IVPB     100 mg 200 mL/hr over 30 Minutes Intravenous Every 30 min 02/14/20 0533 02/14/20 0707   02/14/20 0530  remdesivir 200 mg in sodium chloride 0.9% 250 mL IVPB  Status:  Discontinued     200 mg 580 mL/hr over 30 Minutes Intravenous Once 02/14/20 0519 02/14/20 0533       Time Spent in minutes  30   Lala Lund M.D on 02/16/2020 at 1:38 PM  To page go to [email or web address] - password [facility]  [facility] -  Office  [phone]  See all Orders from today for further details    Objective:   Vitals:   02/15/20 0502 02/15/20 1930 02/16/20 0530  02/16/20 0741  BP: 105/65 (!) 97/55 114/83 105/67  Pulse: 67 61 65 63  Resp: 20 18 18 18   Temp: 97.8 F (36.6 C) 97.8 F (36.6 C) 98.1 F (36.7 C) 98.5 F (36.9 C)  TempSrc: Oral Oral Oral Oral  SpO2: 92% 96% 96% 90%  Weight:      Height:        Wt Readings from Last 3 Encounters:  02/14/20 115.7 kg  08/04/14 108.7 kg     Intake/Output Summary (Last 24 hours) at 02/16/2020 1338 Last data filed at 02/16/2020 0541 Gross per 24 hour  Intake 1350 ml  Output 400 ml  Net 950 ml     Physical Exam  Awake Alert, No new F.N deficits, Normal affect [place].AT,PERRAL Supple Neck,No JVD, No cervical lymphadenopathy appriciated.  Symmetrical Chest wall movement, Good air movement bilaterally, CTAB RRR,No Gallops, Rubs or new Murmurs, No Parasternal  Heave +ve B.Sounds, Abd Soft, No tenderness, No organomegaly appriciated, No rebound - guarding or rigidity. No Cyanosis, Clubbing or edema, No new Rash or bruise    Data Review:    CBC Recent Labs  Lab 02/13/20 1954 02/14/20 0615 02/15/20 0327 02/16/20 0318  WBC 4.5 7.0 5.4 5.7  HGB 14.8 14.8 14.3 14.0  HCT 45.3 45.4 43.8 43.1  PLT 177 160 190 239  MCV 81.9 81.5 80.8 82.1  MCH 26.8 26.6 26.4 26.7  MCHC 32.7 32.6 32.6 32.5  RDW 14.6 14.7 14.6 14.8  LYMPHSABS  --  0.5* 0.6* 0.8  MONOABS  --  0.2 0.3 0.5  EOSABS  --  0.0 0.0 0.0  BASOSABS  --  0.0 0.0 0.0    Chemistries  Recent Labs  Lab 02/13/20 1954 02/14/20 0615 02/15/20 0327 02/16/20 0318  NA 127* 127* 132* 137  K 4.1 5.1 4.8 4.4  CL 92* 92* 95* 100  CO2 23 20* 26 26  GLUCOSE 280* 320* 296* 165*  BUN 8 12 16 14   CREATININE 1.11 1.06 0.98 0.87  CALCIUM 8.0* 8.0* 8.5* 8.6*  AST  --  63* 34 30  ALT  --  34 28 26  ALKPHOS  --  75 63 57  BILITOT  --  1.6* 0.4 0.4  MG  --   --  2.1 1.9     ------------------------------------------------------------------------------------------------------------------ No results for input(s): CHOL, HDL, LDLCALC, TRIG, CHOLHDL, LDLDIRECT in the last 72 hours.  Lab Results  Component Value Date   HGBA1C 7.5% 08/04/2014   ------------------------------------------------------------------------------------------------------------------ No results for input(s): TSH, T4TOTAL, T3FREE, THYROIDAB in the last 72 hours.  Invalid input(s): FREET3  Cardiac Enzymes No results for input(s): CKMB, TROPONINI, MYOGLOBIN in the last 168 hours.  Invalid input(s): CK ------------------------------------------------------------------------------------------------------------------    Component Value Date/Time   BNP 87.5 02/16/2020 0318    Micro Results No results found for this or any previous visit (from the past 240 hour(s)).  Radiology Reports DG Chest 2 View  Result Date:  02/13/2020 CLINICAL DATA:  Shortness of breath EXAM: CHEST - 2 VIEW COMPARISON:  09/18/2009 FINDINGS: Patchy bilateral airspace opacities compatible with pneumonia. Heart is normal size. No effusions or acute bony abnormality. IMPRESSION: Patchy bilateral airspace opacities compatible with multifocal pneumonia, likely COVID pneumonia. Electronically Signed   By: Rolm Baptise M.D.   On: 02/13/2020 20:37

## 2020-02-17 LAB — CBC WITH DIFFERENTIAL/PLATELET
Abs Immature Granulocytes: 0.04 10*3/uL (ref 0.00–0.07)
Basophils Absolute: 0 10*3/uL (ref 0.0–0.1)
Basophils Relative: 0 %
Eosinophils Absolute: 0 10*3/uL (ref 0.0–0.5)
Eosinophils Relative: 0 %
HCT: 42.3 % (ref 39.0–52.0)
Hemoglobin: 13.8 g/dL (ref 13.0–17.0)
Immature Granulocytes: 1 %
Lymphocytes Relative: 24 %
Lymphs Abs: 1.2 10*3/uL (ref 0.7–4.0)
MCH: 26.1 pg (ref 26.0–34.0)
MCHC: 32.6 g/dL (ref 30.0–36.0)
MCV: 80.1 fL (ref 80.0–100.0)
Monocytes Absolute: 0.5 10*3/uL (ref 0.1–1.0)
Monocytes Relative: 11 %
Neutro Abs: 3.1 10*3/uL (ref 1.7–7.7)
Neutrophils Relative %: 64 %
Platelets: 269 10*3/uL (ref 150–400)
RBC: 5.28 MIL/uL (ref 4.22–5.81)
RDW: 14.5 % (ref 11.5–15.5)
WBC: 4.8 10*3/uL (ref 4.0–10.5)
nRBC: 0 % (ref 0.0–0.2)

## 2020-02-17 LAB — D-DIMER, QUANTITATIVE: D-Dimer, Quant: 1 ug/mL-FEU — ABNORMAL HIGH (ref 0.00–0.50)

## 2020-02-17 LAB — COMPREHENSIVE METABOLIC PANEL
ALT: 26 U/L (ref 0–44)
AST: 26 U/L (ref 15–41)
Albumin: 2.3 g/dL — ABNORMAL LOW (ref 3.5–5.0)
Alkaline Phosphatase: 48 U/L (ref 38–126)
Anion gap: 10 (ref 5–15)
BUN: 16 mg/dL (ref 6–20)
CO2: 25 mmol/L (ref 22–32)
Calcium: 8.2 mg/dL — ABNORMAL LOW (ref 8.9–10.3)
Chloride: 102 mmol/L (ref 98–111)
Creatinine, Ser: 0.94 mg/dL (ref 0.61–1.24)
GFR calc Af Amer: >60 mL/min (ref >60)
GFR calc non Af Amer: >60 mL/min (ref >60)
Glucose, Bld: 209 mg/dL — ABNORMAL HIGH (ref 70–99)
Potassium: 3.9 mmol/L (ref 3.5–5.1)
Sodium: 137 mmol/L (ref 135–145)
Total Bilirubin: 0.4 mg/dL (ref 0.3–1.2)
Total Protein: 6.4 g/dL — ABNORMAL LOW (ref 6.5–8.1)

## 2020-02-17 LAB — GLUCOSE, CAPILLARY
Glucose-Capillary: 165 mg/dL — ABNORMAL HIGH (ref 70–99)
Glucose-Capillary: 331 mg/dL — ABNORMAL HIGH (ref 70–99)
Glucose-Capillary: 270 mg/dL — ABNORMAL HIGH (ref 70–99)
Glucose-Capillary: 241 mg/dL — ABNORMAL HIGH (ref 70–99)

## 2020-02-17 LAB — C-REACTIVE PROTEIN: CRP: 2.8 mg/dL — ABNORMAL HIGH (ref <1.0)

## 2020-02-17 LAB — BRAIN NATRIURETIC PEPTIDE: B Natriuretic Peptide: 82.7 pg/mL (ref 0.0–100.0)

## 2020-02-17 LAB — HEMOGLOBIN A1C
Hgb A1c MFr Bld: 8.9 % — ABNORMAL HIGH (ref 4.8–5.6)
Mean Plasma Glucose: 209 mg/dL

## 2020-02-17 LAB — MAGNESIUM: Magnesium: 1.8 mg/dL (ref 1.7–2.4)

## 2020-02-17 MED ORDER — LIVING WELL WITH DIABETES BOOK
Freq: Once | Status: AC
  Filled 2020-02-17: qty 1

## 2020-02-17 NOTE — TOC Initial Note (Signed)
Transition of Care [facility]) - Initial/Assessment Note    Patient Details  Name: Miguel Valdez MRN: [id number] Date of Birth: May 29, 1961  [facility] CM/SW Contact:    Cherylann Parr, RN Phone Number: 02/17/2020, 3:36 PM  Clinical Narrative:   PTA independent from home with wife.  Pt confirms he has a PCP and denied barriers with paying for discharge meds.  Pt in agreement with home oxygen as ordered.  CM offered choice list - pt chose [employer].  Pt confirms he has active insurance - pt to get insurance information from his wife so CM can provide to [employer] for home oxygen.     Update: CM provide insurance information to [employer]              Expected Discharge Plan: Home/Self Care Barriers to Discharge: Continued Medical Work up   Patient Goals and CMS Choice Patient states their goals for this hospitalization and ongoing recovery are:: Pt did not state a goal CMS Medicare.gov Compare Post Acute Care list provided to:: Patient Choice offered to / list presented to : Patient  Expected Discharge Plan and Services Expected Discharge Plan: Home/Self Care     Post Acute Care Choice: Durable Medical Equipment Living arrangements for the past 2 months: Single Family Home                   DME Agency: [employer] Date DME Agency Contacted: 02/17/20 Time DME Agency Contacted: 1535 Representative spoke with at DME Agency: Zack            Prior Living Arrangements/Services Living arrangements for the past 2 months: Single Family Home Lives with:: Spouse Patient language and need for interpreter reviewed:: Yes Do you feel safe going back to the place where you live?: Yes      Need for Family Participation in Patient Care: No (Comment) Care giver support system in place?: Yes (comment)   Criminal Activity/Legal Involvement Pertinent to Current Situation/Hospitalization: No - Comment as needed  Activities of Daily Living Home Assistive Devices/Equipment: None ADL Screening  (condition at time of admission) Patient's cognitive ability adequate to safely complete daily activities?: Yes Is the patient deaf or have difficulty hearing?: No Does the patient have difficulty seeing, even when wearing glasses/contacts?: No Does the patient have difficulty concentrating, remembering, or making decisions?: No Patient able to express need for assistance with ADLs?: Yes Does the patient have difficulty dressing or bathing?: No Independently performs ADLs?: Yes (appropriate for developmental age) Does the patient have difficulty walking or climbing stairs?: No Weakness of Legs: None Weakness of Arms/Hands: None  Permission Sought/Granted   Permission granted to share information with : Yes, Verbal Permission Granted              Emotional Assessment   Attitude/Demeanor/Rapport: Engaged, Gracious Affect (typically observed): Accepting, Adaptable Orientation: : Oriented to Self, Oriented to Place, Oriented to  Time, Oriented to Situation   Psych Involvement: No (comment)  Admission diagnosis:  Shortness of breath [R06.02] Hypoxia [R09.02] Acute respiratory failure due to COVID-19 (HCC) [U07.1, J96.00] COVID-19 virus infection [U07.1] Pneumonia due to COVID-19 virus [U07.1, J12.82] Patient Active Problem List   Diagnosis Date Noted  . Acute respiratory failure due to COVID-19 (HCC) 02/14/2020  . Pneumonia due to COVID-19 virus 02/14/2020  . Diabetes mellitus due to underlying condition without complications (HCC) 08/04/2014  . Essential hypertension, benign 08/04/2014   PCP:  Bryon Lions, PA-C Pharmacy:   [facility] [phone] [address] [address], [place] -  [address] [place] [address] Phone: [phone] Fax: [phone]  [facility], [place] [address] Phone: [phone] Fax:  [phone]     Social Determinants of Health (SDOH) Interventions    Readmission Risk Interventions No flowsheet data found.

## 2020-02-17 NOTE — Progress Notes (Signed)
SATURATION QUALIFICATIONS: (This note is used to comply with regulatory documentation for home oxygen)  Patient Saturations on Room Air at Rest = 94%  Patient Saturations on Room Air while Ambulating = 85%  Patient Saturations on 2 Liters of oxygen while Ambulating = 92%  Please briefly explain why patient needs home oxygen: 

## 2020-02-17 NOTE — Plan of Care (Signed)
  Problem: Health Behavior/Discharge Planning: Goal: Ability to manage health-related needs will improve Outcome: Progressing   Problem: Clinical Measurements: Goal: Will remain free from infection Outcome: Progressing   

## 2020-02-17 NOTE — Progress Notes (Signed)
PT Cancellation Note  Patient Details Name: Miguel Valdez MRN: [id number] DOB: [birth year]   Cancelled Treatment:    Reason Eval/Treat Not Completed: PT screened, no needs identified, will sign off - Pt with documentation of hallway ambulation with no physical assist, per RN pt is completely independent with mobility and ADLs. PT to sign off, please reconsult if needed.  Richrd Sox, PT Acute Rehabilitation Services Pager [phone]  Office [phone]    Tyrone Apple D Despina Hidden 02/17/2020, 3:12 PM

## 2020-02-17 NOTE — Progress Notes (Signed)
PROGRESS NOTE                                                                                                                                                                                                             Patient Demographics:    Miguel Valdez, is a 59 y.o. male, DOB - 05/02/1961, JEH:[id number]  Outpatient Primary MD for the patient is Annye English    LOS - 3  Admit date - 02/13/2020    Chief Complaint  Patient presents with  . Shortness of Breath       Brief Narrative  - 59 year old gentleman with history of type 2 diabetes on oral hypoglycemics at home, hypertension who has been having symptoms of cough and fatigue with low-grade fever for about 5 days, went to urgent care 3 days ago and diagnosed with COVID-19 infection continue to feel short of breath and fatigue so came to the ER he was diagnosed with acute hypoxic respiratory failure due to COVID-19 infection requiring oxygen and was admitted to the hospital.   Subjective:   Patient in bed, appears comfortable, denies any headache, no fever, no chest pain or pressure, no shortness of breath , no abdominal pain. No focal weakness.    Assessment  & Plan :     1. Acute Hypoxic Resp. Failure due to Acute Covid 19 Viral Pneumonitis during the ongoing 2020 Covid 19 Pandemic - he severe disease and was treated appropriately with combination of IV steroids, remdesivir and Actemra.  Hypoxia is improving and clinically looks better, continue to monitor clinically along with inflammatory markers.  Encouraged the patient to sit up in chair in the daytime use I-S and flutter valve for pulmonary toiletry and then prone in bed when at night.  Will advance activity and titrate down oxygen as possible.   SpO2: 94 % O2 Flow Rate (L/min): 1 L/min  Recent Labs  Lab 02/14/20 0516 02/14/20 0615 02/14/20 0849 02/15/20 0327 02/16/20 0318 02/17/20 0341  CRP  --   15.1*  --  12.3* 5.7* 2.8*  DDIMER  --  1.13*  --  1.59* 0.71* 1.00*  BNP 15.8  --   --   --  87.5 82.7  PROCALCITON  --   --  0.11  --   --   --     Hepatic Function  Latest Ref Rng & Units 02/17/2020 02/16/2020 02/15/2020  Total Protein 6.5 - 8.1 g/dL 6.4(L) 6.9 7.4  Albumin 3.5 - 5.0 g/dL 2.3(L) 2.3(L) 2.5(L)  AST 15 - 41 U/L 26 30 34  ALT 0 - 44 U/L 26 26 28   Alk Phosphatase 38 - 126 U/L 48 57 63  Total Bilirubin 0.3 - 1.2 mg/dL 0.4 0.4 0.4    2.  Dehydration with hyponatremia and hypotension.  Resolved after IV fluids.  3.  DM type II.  On Levemir and sliding scale, increase Levemir dose and add Premeal NovoLog for better use with ongoing steroid use.  Lab Results  Component Value Date   HGBA1C 8.9 (H) 02/15/2020    CBG (last 3)  Recent Labs    02/16/20 1528 02/16/20 2132 02/17/20 0743  GLUCAP 229* 252* 165*     Condition - Fair  Family Communication  : His niece on 02/15/2020 - [phone]   Code Status :  Full  Diet :   Diet Order            Diet heart healthy/carb modified Room service appropriate? Yes; Fluid consistency: Thin  Diet effective now               Disposition Plan  : Stay in the hospital and finished treatment for COVID-19 pneumonia with acute hypoxic respiratory failure requiring oxygen, likely discharge home on 02/18/2020.  Consults  :  None  Procedures  :  None  PUD Prophylaxis :   DVT Prophylaxis  :  Lovenox   Lab Results  Component Value Date   PLT 269 02/17/2020    Inpatient Medications  Scheduled Meds: . aspirin EC  81 mg Oral QHS  . dexamethasone (DECADRON) injection  6 mg Intravenous Q24H  . enoxaparin (LOVENOX) injection  55 mg Subcutaneous Daily  . insulin aspart  0-15 Units Subcutaneous TID WC  . insulin aspart  0-5 Units Subcutaneous QHS  . insulin aspart  4 Units Subcutaneous TID WC  . insulin detemir  30 Units Subcutaneous Daily  . sodium chloride flush  3 mL Intravenous Once   Continuous Infusions: .  remdesivir 100 mg in NS 100 mL 100 mg (02/17/20 0802)   PRN Meds:.benzonatate, [DISCONTINUED] ondansetron **OR** ondansetron (ZOFRAN) IV  Antibiotics  :    Anti-infectives (From admission, onward)   Start     Dose/Rate Route Frequency Ordered Stop   02/15/20 1000  remdesivir 100 mg in sodium chloride 0.9 % 100 mL IVPB  Status:  Discontinued     100 mg 200 mL/hr over 30 Minutes Intravenous Daily 02/14/20 0519 02/14/20 0533   02/15/20 1000  remdesivir 100 mg in sodium chloride 0.9 % 100 mL IVPB     100 mg 200 mL/hr over 30 Minutes Intravenous Daily 02/14/20 0533 02/19/20 0959   02/14/20 0600  remdesivir 100 mg in sodium chloride 0.9 % 100 mL IVPB     100 mg 200 mL/hr over 30 Minutes Intravenous Every 30 min 02/14/20 0533 02/14/20 0707   02/14/20 0530  remdesivir 200 mg in sodium chloride 0.9% 250 mL IVPB  Status:  Discontinued     200 mg 580 mL/hr over 30 Minutes Intravenous Once 02/14/20 0519 02/14/20 0533       Time Spent in minutes  30   Lala Lund M.D on 02/17/2020 at 12:14 PM  To page go to [email or web address] - password [email or web address]  [facility] -  Office  [phone]  See all Orders from today for further details  Objective:   Vitals:   02/16/20 0530 02/16/20 0741 02/16/20 2004 02/17/20 0441  BP: 114/83 105/67 (!) 141/91 (!) 148/88  Pulse: 65 63 67 64  Resp: 18 18    Temp: 98.1 F (36.7 C) 98.5 F (36.9 C) 98.3 F (36.8 C) 98.2 F (36.8 C)  TempSrc: Oral Oral Oral Oral  SpO2: 96% 90% 93% 94%  Weight:      Height:        Wt Readings from Last 3 Encounters:  02/14/20 115.7 kg  08/04/14 108.7 kg     Intake/Output Summary (Last 24 hours) at 02/17/2020 1214 Last data filed at 02/16/2020 2100 Gross per 24 hour  Intake 440 ml  Output --  Net 440 ml     Physical Exam  Awake Alert, No new F.N deficits, Normal affect .AT,PERRAL Supple Neck,No JVD, No cervical lymphadenopathy appriciated.  Symmetrical Chest wall movement, Good air movement  bilaterally, CTAB RRR,No Gallops, Rubs or new Murmurs, No Parasternal Heave +ve B.Sounds, Abd Soft, No tenderness, No organomegaly appriciated, No rebound - guarding or rigidity. No Cyanosis, Clubbing or edema, No new Rash or bruise   Data Review:    CBC Recent Labs  Lab 02/13/20 1954 02/14/20 0615 02/15/20 0327 02/16/20 0318 02/17/20 0341  WBC 4.5 7.0 5.4 5.7 4.8  HGB 14.8 14.8 14.3 14.0 13.8  HCT 45.3 45.4 43.8 43.1 42.3  PLT 177 160 190 239 269  MCV 81.9 81.5 80.8 82.1 80.1  MCH 26.8 26.6 26.4 26.7 26.1  MCHC 32.7 32.6 32.6 32.5 32.6  RDW 14.6 14.7 14.6 14.8 14.5  LYMPHSABS  --  0.5* 0.6* 0.8 1.2  MONOABS  --  0.2 0.3 0.5 0.5  EOSABS  --  0.0 0.0 0.0 0.0  BASOSABS  --  0.0 0.0 0.0 0.0    Chemistries  Recent Labs  Lab 02/13/20 1954 02/14/20 0615 02/15/20 0327 02/15/20 1442 02/16/20 0318 02/17/20 0341  NA 127* 127* 132*  --  137 137  K 4.1 5.1 4.8  --  4.4 3.9  CL 92* 92* 95*  --  100 102  CO2 23 20* 26  --  26 25  GLUCOSE 280* 320* 296*  --  165* 209*  BUN 8 12 16   --  14 16  CREATININE 1.11 1.06 0.98  --  0.87 0.94  CALCIUM 8.0* 8.0* 8.5*  --  8.6* 8.2*  AST  --  63* 34  --  30 26  ALT  --  34 28  --  26 26  ALKPHOS  --  75 63  --  57 48  BILITOT  --  1.6* 0.4  --  0.4 0.4  MG  --   --  2.1  --  1.9 1.8  HGBA1C  --   --   --  8.9*  --   --      ------------------------------------------------------------------------------------------------------------------ No results for input(s): CHOL, HDL, LDLCALC, TRIG, CHOLHDL, LDLDIRECT in the last 72 hours.  Lab Results  Component Value Date   HGBA1C 8.9 (H) 02/15/2020   ------------------------------------------------------------------------------------------------------------------ No results for input(s): TSH, T4TOTAL, T3FREE, THYROIDAB in the last 72 hours.  Invalid input(s): FREET3  Cardiac Enzymes No results for input(s): CKMB, TROPONINI, MYOGLOBIN in the last 168 hours.  Invalid input(s):  CK ------------------------------------------------------------------------------------------------------------------    Component Value Date/Time   BNP 82.7 02/17/2020 0341    Micro Results No results found for this or any previous visit (from the past 240 hour(s)).  Radiology Reports DG Chest 2  View  Result Date: 02/13/2020 CLINICAL DATA:  Shortness of breath EXAM: CHEST - 2 VIEW COMPARISON:  09/18/2009 FINDINGS: Patchy bilateral airspace opacities compatible with pneumonia. Heart is normal size. No effusions or acute bony abnormality. IMPRESSION: Patchy bilateral airspace opacities compatible with multifocal pneumonia, likely COVID pneumonia. Electronically Signed   By: Rolm Baptise M.D.   On: 02/13/2020 20:37

## 2020-02-17 NOTE — Progress Notes (Signed)
Patient ambulated in the hall a total of 125 feet on room air.  Oxygen saturations ranged from 85-88%.  Saturations quickly rose back to the 90's once seated in room.  Patient stated that he actually felt better when he was up and walking and had no complaints of any dyspnea.

## 2020-02-17 NOTE — Progress Notes (Signed)
Inpatient Diabetes Program Recommendations  AACE/ADA: New Consensus Statement on Inpatient Glycemic Control (2015)  Target Ranges:  Prepandial:   less than 140 mg/dL      Peak postprandial:   less than 180 mg/dL (1-2 hours)      Critically ill patients:  140 - 180 mg/dL   Lab Results  Component Value Date   GLUCAP 331 (H) 02/17/2020   HGBA1C 8.9 (H) 02/15/2020    Review of Glycemic Control Results for Miguel Valdez, Miguel Valdez (MRN [id number]) as of 02/17/2020 12:38  Ref. Range 02/16/2020 11:29 02/16/2020 15:28 02/16/2020 21:32 02/17/2020 07:43 02/17/2020 12:09  Glucose-Capillary Latest Ref Range: 70 - 99 mg/dL 099 (H) 833 (H) 825 (H) 165 (H) 331 (H)   Diabetes history: DM2 Outpatient Diabetes medications: Janumet 50-1000 mg BID Current orders for Inpatient glycemic control: Levemir 30 units qd + Novolog 4 units tid meal coverage + Novolog correction moderate tid + hs 0-5 units  Inpatient Diabetes Program Recommendations:   Noted postprandial CBGs elevated. While on steroids: -Increase Novolog meal coverage to 6-8 units tid if eats 50%  Thank you, Billy Fischer. Tery Hoeger, RN, MSN, CDE  Diabetes Coordinator Inpatient Glycemic Control Team Team Pager [phone] (8am-5pm) 02/17/2020 12:42 PM

## 2020-02-17 NOTE — Progress Notes (Signed)
Patient ambulated and performed ADL's in room on room air and maintained Oxygen saturations in the mid 90's.  No s/s of distress noted.  Patient had no complaints at this time.

## 2020-02-18 LAB — COMPREHENSIVE METABOLIC PANEL
ALT: 24 U/L (ref 0–44)
AST: 23 U/L (ref 15–41)
Albumin: 2.3 g/dL — ABNORMAL LOW (ref 3.5–5.0)
Alkaline Phosphatase: 47 U/L (ref 38–126)
Anion gap: 9 (ref 5–15)
BUN: 14 mg/dL (ref 6–20)
CO2: 24 mmol/L (ref 22–32)
Calcium: 7.9 mg/dL — ABNORMAL LOW (ref 8.9–10.3)
Chloride: 100 mmol/L (ref 98–111)
Creatinine, Ser: 0.98 mg/dL (ref 0.61–1.24)
GFR calc Af Amer: >60 mL/min (ref >60)
GFR calc non Af Amer: >60 mL/min (ref >60)
Glucose, Bld: 234 mg/dL — ABNORMAL HIGH (ref 70–99)
Potassium: 3.7 mmol/L (ref 3.5–5.1)
Sodium: 133 mmol/L — ABNORMAL LOW (ref 135–145)
Total Bilirubin: 0.7 mg/dL (ref 0.3–1.2)
Total Protein: 6.4 g/dL — ABNORMAL LOW (ref 6.5–8.1)

## 2020-02-18 LAB — CBC WITH DIFFERENTIAL/PLATELET
Abs Immature Granulocytes: 0.16 10*3/uL — ABNORMAL HIGH (ref 0.00–0.07)
Basophils Absolute: 0 10*3/uL (ref 0.0–0.1)
Basophils Relative: 0 %
Eosinophils Absolute: 0 10*3/uL (ref 0.0–0.5)
Eosinophils Relative: 0 %
HCT: 42.8 % (ref 39.0–52.0)
Hemoglobin: 14 g/dL (ref 13.0–17.0)
Immature Granulocytes: 3 %
Lymphocytes Relative: 24 %
Lymphs Abs: 1.3 10*3/uL (ref 0.7–4.0)
MCH: 26.4 pg (ref 26.0–34.0)
MCHC: 32.7 g/dL (ref 30.0–36.0)
MCV: 80.6 fL (ref 80.0–100.0)
Monocytes Absolute: 0.4 10*3/uL (ref 0.1–1.0)
Monocytes Relative: 8 %
Neutro Abs: 3.4 10*3/uL (ref 1.7–7.7)
Neutrophils Relative %: 65 %
Platelets: 302 10*3/uL (ref 150–400)
RBC: 5.31 MIL/uL (ref 4.22–5.81)
RDW: 14.4 % (ref 11.5–15.5)
WBC: 5.2 10*3/uL (ref 4.0–10.5)
nRBC: 0 % (ref 0.0–0.2)

## 2020-02-18 LAB — D-DIMER, QUANTITATIVE: D-Dimer, Quant: 1.34 ug/mL-FEU — ABNORMAL HIGH (ref 0.00–0.50)

## 2020-02-18 LAB — C-REACTIVE PROTEIN: CRP: 1.4 mg/dL — ABNORMAL HIGH (ref <1.0)

## 2020-02-18 LAB — GLUCOSE, CAPILLARY: Glucose-Capillary: 171 mg/dL — ABNORMAL HIGH (ref 70–99)

## 2020-02-18 LAB — MAGNESIUM: Magnesium: 1.8 mg/dL (ref 1.7–2.4)

## 2020-02-18 LAB — BRAIN NATRIURETIC PEPTIDE: B Natriuretic Peptide: 127.7 pg/mL — ABNORMAL HIGH (ref 0.0–100.0)

## 2020-02-18 MED ORDER — APIXABAN 2.5 MG PO TABS: 2.5000 mg | ORAL_TABLET | Freq: Two times a day (BID) | ORAL | 0 refills | Status: AC

## 2020-02-18 MED ORDER — AMLODIPINE BESYLATE 10 MG PO TABS: 10.0000 mg | ORAL_TABLET | Freq: Every day | ORAL | Status: DC

## 2020-02-18 MED FILL — ELIQUIS 2.5 MG TABLET: 2.5 | 14 days supply | Qty: 28 | Fill #0

## 2020-02-18 NOTE — Progress Notes (Signed)
Pt discharged home with wife. DC instructions provided to patients and questions answered. Eliquis card and prescription handed to patient. Home O2 at bedside also. All belongings with patient.

## 2020-02-18 NOTE — TOC Transition Note (Addendum)
Transition of Care [facility]) - CM/SW Discharge Note   Patient Details  Name: Miguel Valdez MRN: [id number] Date of Birth: 1961-08-08  Transition of Care [facility]) CM/SW Contact:  Cherylann Parr, RN Phone Number: 02/18/2020, 8:41 AM   Clinical Narrative:   Pt deemed stable for discharge.  [facility] aware that pt will discharge home today.    Discharge order written 02/18/20 at 7:19am. CM has satisfied all current TOC orders/consults.  No other TOC needs identified.  Medication reconciliation is not yet complete - this is the barrier for CM to sign off  Update: Pt will discharge home on Eliquis.  TOC will provide first month supply and CM provided reduced copay card for ongoing supply.  Pt left [facility] facility before benefit check could be completed.  CM attempted to contact pt to discuss possible prior auth however unable to reach.  CM spoke with PCP office and informed that pt may need PA for elqiuis - pt has 30 day supply   Final next level of care: Home/Self Care Barriers to Discharge: Barriers Resolved   Patient Goals and CMS Choice Patient states their goals for this hospitalization and ongoing recovery are:: Pt did not state a goal CMS Medicare.gov Compare Post Acute Care list provided to:: Patient Choice offered to / list presented to : Patient  Discharge Placement                       Discharge Plan and Services     Post Acute Care Choice: Durable Medical Equipment            DME Agency: [employer] Date DME Agency Contacted: 02/17/20 Time DME Agency Contacted: 1535 Representative spoke with at DME Agency: Zack            Social Determinants of Health (SDOH) Interventions     Readmission Risk Interventions No flowsheet data found.

## 2020-02-18 NOTE — Discharge Summary (Signed)
Miguel Valdez GNF:[id number] DOB: November 29, 1960 DOA: 02/13/2020  PCP: Loyola Mast, PA-C  Admit date: 02/13/2020  Discharge date: 02/18/2020  Admitted From: Home   Disposition:  Home   Recommendations for Outpatient Follow-up:   Follow up with PCP in 1-2 weeks  PCP Please obtain BMP/CBC, 2 view CXR in 1week,  (see Discharge instructions)   PCP Please follow up on the following pending results:    Home Health: None   Equipment/Devices: None  Consultations: None  Discharge Condition: Stable    CODE STATUS: Full    Diet Recommendation: Heart Healthy Low Carb  Diet Order            Diet heart healthy/carb modified Room service appropriate? Yes; Fluid consistency: Thin  Diet effective now               Chief Complaint  Patient presents with  . Shortness of Breath     Brief history of present illness from the day of admission and additional interim summary    59 year old gentleman with history of type 2 diabetes on oral hypoglycemics at home, hypertension who has been having symptoms of cough and fatigue with low-grade fever for about 5 days, went to urgent care 3 days ago and diagnosed with COVID-19 infection continue to feel short of breath and fatigue so came to the ER he was diagnosed with acute hypoxic respiratory failure due to COVID-19 infection requiring oxygen and was admitted to the hospital.                                                                 Hospital Course   1. Acute Hypoxic Resp. Failure due to Acute Covid 19 Viral Pneumonitis during the ongoing 2020 Covid 19 Pandemic - he had moderate to severe disease and was treated appropriately with combination of IV steroids, remdesivir and Actemra.    Hypoxia inflammatory markers have improved, he is symptom-free on room air now and ambulating  without any distress, will be discharged home, his D-dimer is still slightly elevated for which she will get 2 weeks of Eliquis, CM requested to provide 30-day coupon and prescription forwarded [facility].    Recent Labs  Lab 02/14/20 0516 02/14/20 0615 02/14/20 0849 02/15/20 0327 02/16/20 0318 02/17/20 0341 02/18/20 0346  CRP  --  15.1*  --  12.3* 5.7* 2.8* 1.4*  DDIMER  --  1.13*  --  1.59* 0.71* 1.00* 1.34*  FERRITIN  --  456*  --  437*  --   --   --   BNP 15.8  --   --   --  87.5 82.7 127.7*  PROCALCITON  --   --  0.11  --   --   --   --  Hepatic Function Latest Ref Rng & Units 02/18/2020 02/17/2020 02/16/2020  Total Protein 6.5 - 8.1 g/dL 6.4(L) 6.4(L) 6.9  Albumin 3.5 - 5.0 g/dL 2.3(L) 2.3(L) 2.3(L)  AST 15 - 41 U/L _0 ALT 0 - 44 U/L _1 Alk Phosphatase 38 - 126 U/L 47 48 57  Total Bilirubin 0.3 - 1.2 mg/dL 0.7 0.4 0.4    2.  Dehydration with hyponatremia and hypotension.  Resolved after IV fluids.  3.  DM type II.  Continue home regimen upon discharge.  Outpatient control due to hyperglycemia, PCP to monitor and adjust.  Lab Results  Component Value Date   HGBA1C 8.9 (H) 02/15/2020     Discharge diagnosis     Principal Problem:   Acute respiratory failure due to COVID-19 [facility]) Active Problems:   Diabetes mellitus due to underlying condition without complications (HCC)   Essential hypertension, benign   Pneumonia due to COVID-19 virus    Discharge instructions    Discharge Instructions    Discharge instructions   Complete by: As directed    Follow with Primary MD Loyola Mast, PA-C in 7 days   Get CBC, CMP, 2 view Chest X ray -  checked next visit within 1 week by Primary MD    Activity: As tolerated with Full fall precautions use walker/cane & assistance as needed  Disposition Home   Diet: Heart Healthy Low carb  Special Instructions: If you have smoked or chewed Tobacco  in the last 2 yrs  please stop smoking, stop any regular Alcohol  and or any Recreational drug use.  On your next visit with your primary care physician please Get Medicines reviewed and adjusted.  Please request your Prim.MD to go over all Hospital Tests and Procedure/Radiological results at the follow up, please get all Hospital records sent to your Prim MD by signing hospital release before you go home.  If you experience worsening of your admission symptoms, develop shortness of breath, life threatening emergency, suicidal or homicidal thoughts you must seek medical attention immediately by calling 911 or calling your MD immediately  if symptoms less severe.  You Must read complete instructions/literature along with all the possible adverse reactions/side effects for all the Medicines you take and that have been prescribed to you. Take any new Medicines after you have completely understood and accpet all the possible adverse reactions/side effects.   Increase activity slowly   Complete by: As directed    MyChart COVID-19 home monitoring program   Complete by: Feb 18, 2020    Is the patient willing to use the [place] for home monitoring?: Yes   Temperature monitoring   Complete by: Feb 18, 2020    After how many days would you like to receive a notification of this patient's flowsheet entries?: 1      Discharge Medications   Allergies as of 02/18/2020   No Known Allergies     Medication List    STOP taking these medications   HYDROcodone-acetaminophen 5-325 MG tablet Commonly known as: NORCO/VICODIN     TAKE these medications   acetaminophen 500 MG tablet Commonly known as: TYLENOL Take 1,000 mg by mouth every 6 (six) hours as needed (for fever and/or pain).   apixaban 2.5 MG Tabs tablet Commonly known as: Eliquis Take 1 tablet (2.5 mg total) by mouth 2 (two) times daily.   aspirin EC 81 MG tablet Take 81 mg by mouth at bedtime.   atenolol 50  MG tablet Commonly known as:  TENORMIN Take 1 tablet (50 mg total) by mouth daily.   glucose monitoring kit monitoring kit 1 each by Does not apply route as needed for other. Dispense any model that is covered- dispense testing supplies for Q AC/ HS accuchecks- 1 month supply with one refil.   hydrochlorothiazide 25 MG tablet Commonly known as: HYDRODIURIL Take 1 tablet (25 mg total) by mouth daily.   NON FORMULARY Take 1 tablet by mouth See admin instructions. Neurorubine forte Lactab- Take 1 tablet by mouth once a day for neuropathic pain   NON FORMULARY Take 5 mg by mouth See admin instructions. Concor 5 mg tablets: Take 1 tablet by mouth once a day   sitaGLIPtin-metformin 50-1000 MG tablet Commonly known as: JANUMET Take 1 tablet by mouth 2 (two) times daily with a meal.       Follow-up Information    Loyola Mast, PA-C. Schedule an appointment as soon as possible for a visit in 1 week(s).   Specialty: Physician Assistant Contact information: [place] [phone]           Major procedures and Radiology Reports - PLEASE review detailed and final reports thoroughly  -         DG Chest 2 View  Result Date: 02/13/2020 CLINICAL DATA:  Shortness of breath EXAM: CHEST - 2 VIEW COMPARISON:  09/18/2009 FINDINGS: Patchy bilateral airspace opacities compatible with pneumonia. Heart is normal size. No effusions or acute bony abnormality. IMPRESSION: Patchy bilateral airspace opacities compatible with multifocal pneumonia, likely COVID pneumonia. Electronically Signed   By: Rolm Baptise M.D.   On: 02/13/2020 20:37    Micro Results     No results found for this or any previous visit (from the past 240 hour(s)).  Today   Subjective    Miguel Valdez today has no headache,no chest abdominal pain,no new weakness tingling or numbness, feels much better wants to go home today.     Objective   Blood pressure 130/75, pulse 73, temperature 98.2 F (36.8  C), temperature source Oral, resp. rate 20, height '5\' 10"'$  (1.778 m), weight 115.7 kg, SpO2 100 %.   Intake/Output Summary (Last 24 hours) at 02/18/2020 1058 Last data filed at 02/17/2020 1230 Gross per 24 hour  Intake 260 ml  Output 1000 ml  Net -740 ml    Exam Awake Alert,  No new F.N deficits, Normal affect [place].AT,PERRAL Supple Neck,No JVD, No cervical lymphadenopathy appriciated.  Symmetrical Chest wall movement, Good air movement bilaterally, CTAB RRR,No Gallops,Rubs or new Murmurs, No Parasternal Heave +ve B.Sounds, Abd Soft, Non tender, No organomegaly appriciated, No rebound -guarding or rigidity. No Cyanosis, Clubbing or edema, No new Rash or bruise   Data Review   CBC w Diff:  Lab Results  Component Value Date   WBC 5.2 02/18/2020   HGB 14.0 02/18/2020   HCT 42.8 02/18/2020   PLT 302 02/18/2020   LYMPHOPCT 24 02/18/2020   MONOPCT 8 02/18/2020   EOSPCT 0 02/18/2020   BASOPCT 0 02/18/2020    CMP:  Lab Results  Component Value Date   NA 133 (L) 02/18/2020   K 3.7 02/18/2020   CL 100 02/18/2020   CO2 24 02/18/2020   BUN 14 02/18/2020   CREATININE 0.98 02/18/2020   CREATININE 0.95 08/11/2014   PROT 6.4 (L) 02/18/2020   ALBUMIN 2.3 (L) 02/18/2020   BILITOT 0.7 02/18/2020   ALKPHOS 47 02/18/2020   AST  23 02/18/2020   ALT 24 02/18/2020  .   Total Time in preparing paper work, data evaluation and todays exam - 60 minutes  Lala Lund M.D on 02/18/2020 at 10:58 AM  [facility]   Office  [phone]

## 2020-02-18 NOTE — Discharge Instructions (Signed)
Follow with Primary MD Bryon Lions, PA-C in 7 days   Get CBC, CMP, 2 view Chest X ray -  checked next visit within 1 week by Primary MD    Activity: As tolerated with Full fall precautions use walker/cane & assistance as needed  Disposition Home   Diet: Heart Healthy Low carb  Special Instructions: If you have smoked or chewed Tobacco  in the last 2 yrs please stop smoking, stop any regular Alcohol  and or any Recreational drug use.  On your next visit with your primary care physician please Get Medicines reviewed and adjusted.  Please request your Prim.MD to go over all Hospital Tests and Procedure/Radiological results at the follow up, please get all Hospital records sent to your Prim MD by signing hospital release before you go home.  If you experience worsening of your admission symptoms, develop shortness of breath, life threatening emergency, suicidal or homicidal thoughts you must seek medical attention immediately by calling 911 or calling your MD immediately  if symptoms less severe.  You Must read complete instructions/literature along with all the possible adverse reactions/side effects for all the Medicines you take and that have been prescribed to you. Take any new Medicines after you have completely understood and accpet all the possible adverse reactions/side effects.      Person Under Monitoring Name: Miguel Valdez  Location: [address] [place] [address]   Infection Prevention Recommendations for Individuals Confirmed to have, or Being Evaluated for, 2019 Novel Coronavirus (COVID-19) Infection Who Receive Care at Home  Individuals who are confirmed to have, or are being evaluated for, COVID-19 should follow the prevention steps below until a healthcare provider or local or state health department says they can return to normal activities.  Stay home except to get medical care You should restrict activities outside your home, except for getting  medical care. Do not go to work, school, or public areas, and do not use public transportation or taxis.  Call ahead before visiting your doctor Before your medical appointment, call the healthcare provider and tell them that you have, or are being evaluated for, COVID-19 infection. This will help the healthcare provider's office take steps to keep other people from getting infected. Ask your healthcare provider to call the local or state health department.  Monitor your symptoms Seek prompt medical attention if your illness is worsening (e.g., difficulty breathing). Before going to your medical appointment, call the healthcare provider and tell them that you have, or are being evaluated for, COVID-19 infection. Ask your healthcare provider to call the local or state health department.  Wear a facemask You should wear a facemask that covers your nose and mouth when you are in the same room with other people and when you visit a healthcare provider. People who live with or visit you should also wear a facemask while they are in the same room with you.  Separate yourself from other people in your home As much as possible, you should stay in a different room from other people in your home. Also, you should use a separate bathroom, if available.  Avoid sharing household items You should not share dishes, drinking glasses, cups, eating utensils, towels, bedding, or other items with other people in your home. After using these items, you should wash them thoroughly with soap and water.  Cover your coughs and sneezes Cover your mouth and nose with a tissue when you cough or sneeze, or you can cough or  sneeze into your sleeve. Throw used tissues in a lined trash can, and immediately wash your hands with soap and water for at least 20 seconds or use an alcohol-based hand rub.  Wash your [facility] your hands often and thoroughly with soap and water for at least 20 seconds. You can use an  alcohol-based hand sanitizer if soap and water are not available and if your hands are not visibly dirty. Avoid touching your eyes, nose, and mouth with unwashed hands.   Prevention Steps for Caregivers and Household Members of Individuals Confirmed to have, or Being Evaluated for, COVID-19 Infection Being Cared for in the Home  If you live with, or provide care at home for, a person confirmed to have, or being evaluated for, COVID-19 infection please follow these guidelines to prevent infection:  Follow healthcare provider's instructions Make sure that you understand and can help the patient follow any healthcare provider instructions for all care.  Provide for the patient's basic needs You should help the patient with basic needs in the home and provide support for getting groceries, prescriptions, and other personal needs.  Monitor the patient's symptoms If they are getting sicker, call his or her medical provider and tell them that the patient has, or is being evaluated for, COVID-19 infection. This will help the healthcare provider's office take steps to keep other people from getting infected. Ask the healthcare provider to call the local or state health department.  Limit the number of people who have contact with the patient  If possible, have only one caregiver for the patient.  Other household members should stay in another home or place of residence. If this is not possible, they should stay  in another room, or be separated from the patient as much as possible. Use a separate bathroom, if available.  Restrict visitors who do not have an essential need to be in the home.  Keep older adults, very young children, and other sick people away from the patient Keep older adults, very young children, and those who have compromised immune systems or chronic health conditions away from the patient. This includes people with chronic heart, lung, or kidney conditions, diabetes, and  cancer.  Ensure good ventilation Make sure that shared spaces in the home have good air flow, such as from an air conditioner or an opened window, weather permitting.  Wash your hands often  Wash your hands often and thoroughly with soap and water for at least 20 seconds. You can use an alcohol based hand sanitizer if soap and water are not available and if your hands are not visibly dirty.  Avoid touching your eyes, nose, and mouth with unwashed hands.  Use disposable paper towels to dry your hands. If not available, use dedicated cloth towels and replace them when they become wet.  Wear a facemask and gloves  Wear a disposable facemask at all times in the room and gloves when you touch or have contact with the patient's blood, body fluids, and/or secretions or excretions, such as sweat, saliva, sputum, nasal mucus, vomit, urine, or feces.  Ensure the mask fits over your nose and mouth tightly, and do not touch it during use.  Throw out disposable facemasks and gloves after using them. Do not reuse.  Wash your hands immediately after removing your facemask and gloves.  If your personal clothing becomes contaminated, carefully remove clothing and launder. Wash your hands after handling contaminated clothing.  Place all used disposable facemasks, gloves, and other waste  in a lined container before disposing them with other household waste.  Remove gloves and wash your hands immediately after handling these items.  Do not share dishes, glasses, or other household items with the patient  Avoid sharing household items. You should not share dishes, drinking glasses, cups, eating utensils, towels, bedding, or other items with a patient who is confirmed to have, or being evaluated for, COVID-19 infection.  After the person uses these items, you should wash them thoroughly with soap and water.  Wash laundry thoroughly  Immediately remove and wash clothes or bedding that have blood, body  fluids, and/or secretions or excretions, such as sweat, saliva, sputum, nasal mucus, vomit, urine, or feces, on them.  Wear gloves when handling laundry from the patient.  Read and follow directions on labels of laundry or clothing items and detergent. In general, wash and dry with the warmest temperatures recommended on the label.  Clean all areas the individual has used often  Clean all touchable surfaces, such as counters, tabletops, doorknobs, bathroom fixtures, toilets, phones, keyboards, tablets, and bedside tables, every day. Also, clean any surfaces that may have blood, body fluids, and/or secretions or excretions on them.  Wear gloves when cleaning surfaces the patient has come in contact with.  Use a diluted bleach solution (e.g., dilute bleach with 1 part bleach and 10 parts water) or a household disinfectant with a label that says EPA-registered for coronaviruses. To make a bleach solution at home, add 1 tablespoon of bleach to 1 quart (4 cups) of water. For a larger supply, add  cup of bleach to 1 gallon (16 cups) of water.  Read labels of cleaning products and follow recommendations provided on product labels. Labels contain instructions for safe and effective use of the cleaning product including precautions you should take when applying the product, such as wearing gloves or eye protection and making sure you have good ventilation during use of the product.  Remove gloves and wash hands immediately after cleaning.  Monitor yourself for signs and symptoms of illness Caregivers and household members are considered close contacts, should monitor their health, and will be asked to limit movement outside of the home to the extent possible. Follow the monitoring steps for close contacts listed on the symptom monitoring form.   ? If you have additional questions, contact your local health department or call the epidemiologist on call at [phone] (available 24/7). ? This  guidance is subject to change. For the most up-to-date guidance from [facility], please refer to their website: [email or web address].pl

## 2020-02-18 NOTE — Plan of Care (Signed)
  Problem: Education: Goal: Knowledge of General Education information will improve Description Including pain rating scale, medication(s)/side effects and non-pharmacologic comfort measures Outcome: Progressing   Problem: Health Behavior/Discharge Planning: Goal: Ability to manage health-related needs will improve Outcome: Progressing   

## 2020-02-18 NOTE — Progress Notes (Signed)
SATURATION QUALIFICATIONS: (This note is used to comply with regulatory documentation for home oxygen)  Patient Saturations on Room Air at Rest = 91%  Patient Saturations on Room Air while Ambulating = 88%  Patient dropped down to 88% briefly while ambulating but shortly thereafter popped back up to 92% on RA. Patient tolerated the walk and had no complaints of SOB, pain, or dizziness. O2 sats are now 100% on RA sitting down on the bed.     02/18/20 0754  Vitals  BP (!) 136/96  MAP (mmHg) 109  BP Location Right Arm  BP Method Automatic  Patient Position (if appropriate) Sitting  Pulse Rate 73  Pulse Rate Source Monitor  Resp 20

## 2020-02-18 NOTE — Progress Notes (Signed)
Inpatient Diabetes Program Recommendations  AACE/ADA: New Consensus Statement on Inpatient Glycemic Control (2015)  Target Ranges:  Prepandial:   less than 140 mg/dL      Peak postprandial:   less than 180 mg/dL (1-2 hours)      Critically ill patients:  140 - 180 mg/dL   Lab Results  Component Value Date   GLUCAP 171 (H) 02/18/2020   HGBA1C 8.9 (H) 02/15/2020    Review of Glycemic Control Results for Miguel Valdez, Miguel Valdez (MRN [id number]) as of 02/18/2020 09:13  Ref. Range 02/17/2020 07:43 02/17/2020 12:09 02/17/2020 17:16 02/17/2020 22:06 02/18/2020 08:04  Glucose-Capillary Latest Ref Range: 70 - 99 mg/dL 146 (H) 431 (H) 427 (H) 241 (H) 171 (H)   Diabetes history: DM2 Outpatient Diabetes medications: Janumet 50-1000 mg BID Current orders for Inpatient glycemic control: Levemir 30 units qd + Novolog 4 units tid meal coverage + Novolog correction moderate tid + hs 0-5 units  Inpatient Diabetes Program Recommendations:   Noted postprandial CBGs continue to be elevated. While on steroids, consider: -Increase Novolog meal coverage to 6-8 units tid if eats 50%    Thank you, Billy Fischer. Sia Gabrielsen, RN, MSN, CDE  Diabetes Coordinator Inpatient Glycemic Control Team Team Pager [phone] (8am-5pm) 02/18/2020 9:14 AM

## 2020-10-24 IMAGING — DX DG CHEST 2V
2 series · 2 of 2 positions shown · non-contrast
Comparison: 09/18/2009

CLINICAL DATA: Shortness of breath

EXAM:
CHEST - 2 VIEW

[chest pa]
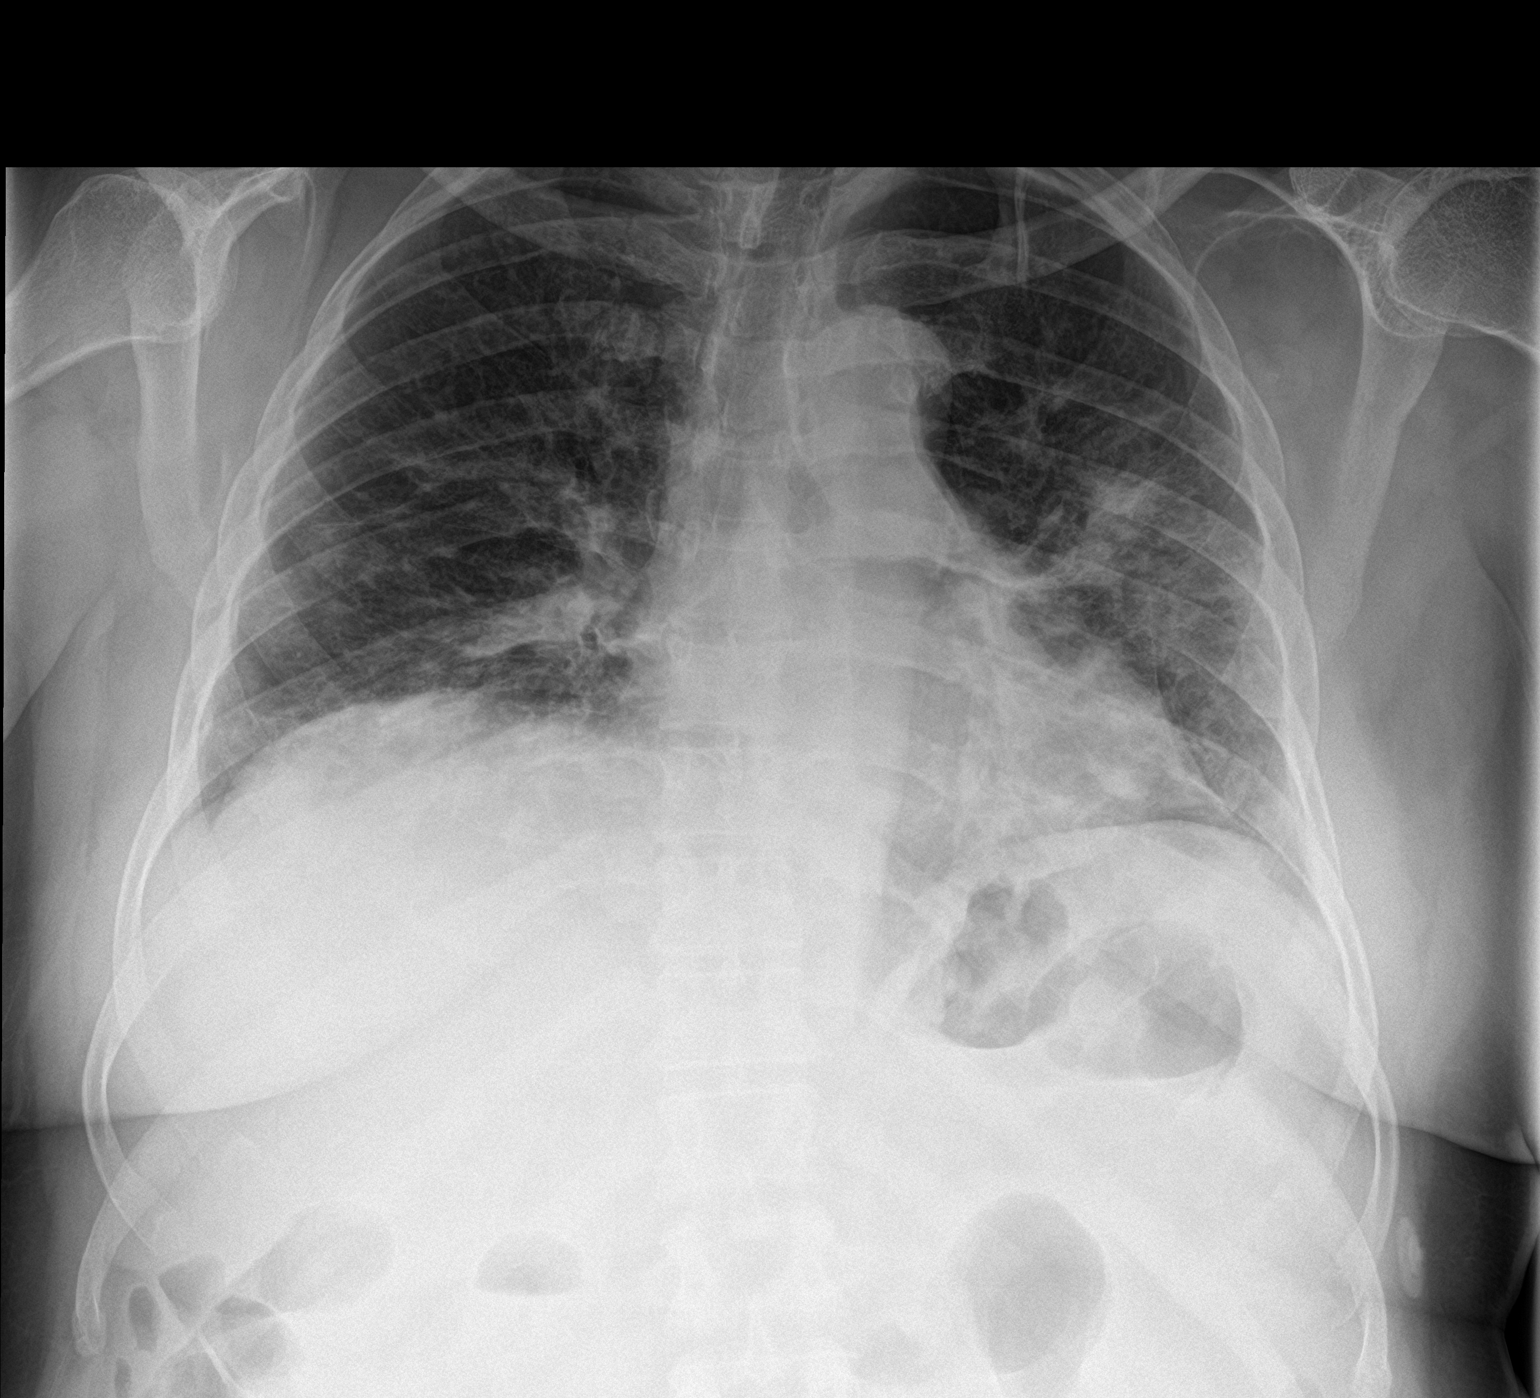

[chest lat]
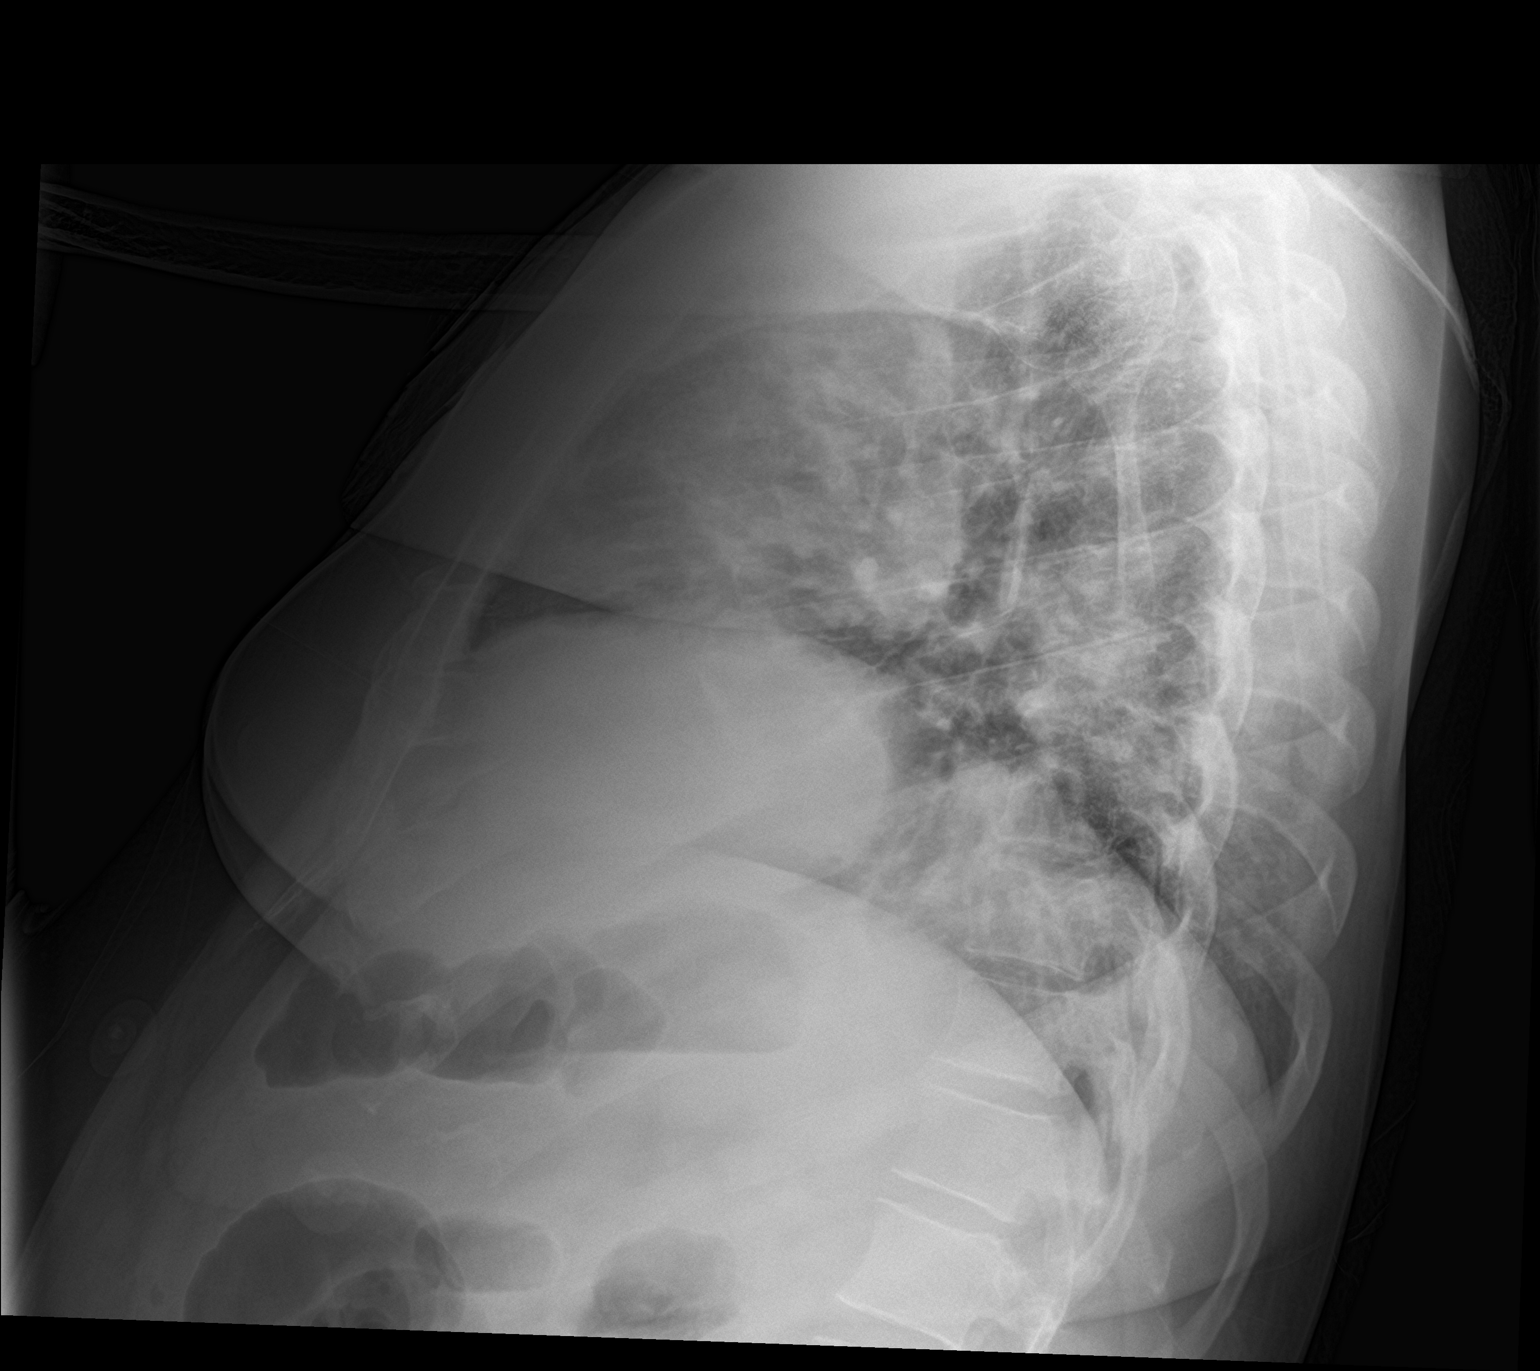

[2 of 2 positions shown; findings below may reference images not displayed]

FINDINGS: Patchy bilateral airspace opacities compatible with pneumonia. Heart
is normal size. No effusions or acute bony abnormality.
IMPRESSION: Patchy bilateral airspace opacities compatible with multifocal
pneumonia, likely COVID pneumonia.
